# Patient Record
Sex: Female | Born: 1965 | Race: White | Hispanic: No | Marital: Single | Smoking: Current every day smoker
Health system: Southern US, Community
[De-identification: ages and names within clinical notes are randomized; demographics above are authoritative.]

## PROBLEM LIST (undated history)

## (undated) DIAGNOSIS — F17200 Nicotine dependence, unspecified, uncomplicated: Secondary | ICD-10-CM

## (undated) DIAGNOSIS — R0689 Other abnormalities of breathing: Secondary | ICD-10-CM

## (undated) DIAGNOSIS — C4490 Unspecified malignant neoplasm of skin, unspecified: Secondary | ICD-10-CM

## (undated) DIAGNOSIS — G8929 Other chronic pain: Secondary | ICD-10-CM

## (undated) DIAGNOSIS — J45909 Unspecified asthma, uncomplicated: Secondary | ICD-10-CM

## (undated) DIAGNOSIS — K8020 Calculus of gallbladder without cholecystitis without obstruction: Secondary | ICD-10-CM

## (undated) DIAGNOSIS — G4700 Insomnia, unspecified: Secondary | ICD-10-CM

## (undated) DIAGNOSIS — R319 Hematuria, unspecified: Secondary | ICD-10-CM

## (undated) DIAGNOSIS — F411 Generalized anxiety disorder: Secondary | ICD-10-CM

## (undated) DIAGNOSIS — G629 Polyneuropathy, unspecified: Secondary | ICD-10-CM

## (undated) DIAGNOSIS — N912 Amenorrhea, unspecified: Secondary | ICD-10-CM

## (undated) DIAGNOSIS — G4709 Other insomnia: Secondary | ICD-10-CM

## (undated) DIAGNOSIS — R079 Chest pain, unspecified: Secondary | ICD-10-CM

## (undated) DIAGNOSIS — I2510 Atherosclerotic heart disease of native coronary artery without angina pectoris: Secondary | ICD-10-CM

## (undated) HISTORY — DX: Other abnormalities of breathing: R06.89

## (undated) HISTORY — DX: Amenorrhea, unspecified: N91.2

## (undated) HISTORY — DX: Unspecified malignant neoplasm of skin, unspecified: C44.90

## (undated) HISTORY — DX: Other chronic pain: G89.29

## (undated) HISTORY — DX: Chest pain, unspecified: R07.9

## (undated) HISTORY — DX: Unspecified asthma, uncomplicated: J45.909

## (undated) HISTORY — DX: Other insomnia: G47.09

## (undated) HISTORY — DX: Atherosclerotic heart disease of native coronary artery without angina pectoris: I25.10

## (undated) HISTORY — DX: Insomnia, unspecified: G47.00

## (undated) HISTORY — DX: Calculus of gallbladder without cholecystitis without obstruction: K80.20

## (undated) HISTORY — DX: Hematuria, unspecified: R31.9

## (undated) HISTORY — DX: Nicotine dependence, unspecified, uncomplicated: F17.200

## (undated) HISTORY — DX: Generalized anxiety disorder: F41.1

## (undated) HISTORY — DX: Polyneuropathy, unspecified: G62.9

---

## 2004-12-31 ENCOUNTER — Inpatient Hospital Stay: Payer: Self-pay | Admitting: Internal Medicine

## 2004-12-31 ENCOUNTER — Other Ambulatory Visit: Payer: Self-pay

## 2005-06-05 ENCOUNTER — Inpatient Hospital Stay: Payer: Self-pay

## 2005-06-05 ENCOUNTER — Other Ambulatory Visit: Payer: Self-pay

## 2005-07-13 ENCOUNTER — Inpatient Hospital Stay: Payer: Self-pay | Admitting: Internal Medicine

## 2005-07-30 ENCOUNTER — Emergency Department (HOSPITAL_COMMUNITY): Admission: EM | Admit: 2005-07-30 | Discharge: 2005-07-30 | Payer: Self-pay | Admitting: Emergency Medicine

## 2005-09-08 ENCOUNTER — Ambulatory Visit: Payer: Self-pay | Admitting: Oncology

## 2006-02-14 HISTORY — PX: INTRAUTERINE DEVICE INSERTION: SHX323

## 2006-04-18 ENCOUNTER — Inpatient Hospital Stay: Payer: Self-pay | Admitting: Internal Medicine

## 2006-04-18 ENCOUNTER — Other Ambulatory Visit: Payer: Self-pay

## 2006-04-19 ENCOUNTER — Other Ambulatory Visit: Payer: Self-pay

## 2006-04-20 ENCOUNTER — Other Ambulatory Visit: Payer: Self-pay

## 2006-08-10 ENCOUNTER — Ambulatory Visit: Payer: Self-pay | Admitting: Internal Medicine

## 2006-08-16 ENCOUNTER — Ambulatory Visit: Payer: Self-pay | Admitting: Internal Medicine

## 2006-09-12 ENCOUNTER — Ambulatory Visit: Payer: Self-pay | Admitting: Gastroenterology

## 2006-09-12 HISTORY — PX: COLONOSCOPY: SHX174

## 2006-11-09 ENCOUNTER — Ambulatory Visit: Payer: Self-pay | Admitting: Internal Medicine

## 2007-01-14 ENCOUNTER — Other Ambulatory Visit: Payer: Self-pay

## 2007-01-14 ENCOUNTER — Emergency Department: Payer: Self-pay | Admitting: Emergency Medicine

## 2010-04-04 ENCOUNTER — Emergency Department: Payer: Self-pay | Admitting: Emergency Medicine

## 2012-02-19 ENCOUNTER — Emergency Department: Payer: Self-pay | Admitting: Emergency Medicine

## 2012-02-19 LAB — URINALYSIS, COMPLETE
Leukocyte Esterase: NEGATIVE
Ph: 5 (ref 4.5–8.0)
Protein: 100
RBC,UR: 10 /HPF (ref 0–5)
Specific Gravity: 1.031 (ref 1.003–1.030)
WBC UR: 12 /HPF (ref 0–5)

## 2012-02-19 LAB — TROPONIN I: Troponin-I: <0.02 ng/mL

## 2012-02-19 LAB — COMPREHENSIVE METABOLIC PANEL
Albumin: 3.8 g/dL (ref 3.4–5.0)
Alkaline Phosphatase: 104 U/L (ref 50–136)
Anion Gap: 14 (ref 7–16)
BUN: 12 mg/dL (ref 7–18)
Calcium, Total: 9.2 mg/dL (ref 8.5–10.1)
Chloride: 100 mmol/L (ref 98–107)
Co2: 25 mmol/L (ref 21–32)
EGFR (African American): >60
Glucose: 83 mg/dL (ref 65–99)
Potassium: 3.2 mmol/L — ABNORMAL LOW (ref 3.5–5.1)

## 2012-02-19 LAB — CBC
MCH: 30.4 pg (ref 26.0–34.0)
MCHC: 33.7 g/dL (ref 32.0–36.0)
MCV: 90 fL (ref 80–100)
Platelet: 101 10*3/uL — ABNORMAL LOW (ref 150–440)
RDW: 13.5 % (ref 11.5–14.5)

## 2012-02-19 LAB — CK TOTAL AND CKMB (NOT AT ARMC)
CK, Total: 103 U/L (ref 21–215)
CK-MB: <0.5 ng/mL — ABNORMAL LOW (ref 0.5–3.6)

## 2013-11-05 ENCOUNTER — Emergency Department: Payer: Self-pay | Admitting: Emergency Medicine

## 2014-08-14 ENCOUNTER — Ambulatory Visit: Payer: Self-pay | Admitting: Surgery

## 2014-09-08 HISTORY — PX: COLONOSCOPY: SHX174

## 2014-10-09 ENCOUNTER — Telehealth: Payer: Self-pay | Admitting: General Surgery

## 2014-10-09 ENCOUNTER — Ambulatory Visit: Payer: Self-pay | Admitting: General Surgery

## 2014-10-09 NOTE — Telephone Encounter (Signed)
Patient was a NO SHOW for her appointment with Dr Adonis Huguenin on 10/09/14

## 2014-10-22 ENCOUNTER — Ambulatory Visit (INDEPENDENT_AMBULATORY_CARE_PROVIDER_SITE_OTHER): Payer: Medicare Other | Admitting: Surgery

## 2014-10-22 ENCOUNTER — Encounter: Payer: Self-pay | Admitting: Surgery

## 2014-10-22 VITALS — BP 114/72 | HR 85 | Temp 98.1°F | Ht 65.0 in | Wt 116.0 lb

## 2014-10-22 DIAGNOSIS — K5909 Other constipation: Secondary | ICD-10-CM | POA: Diagnosis not present

## 2014-10-22 NOTE — Progress Notes (Signed)
Surgery Consult Note  CC: Bloating with PO, 1 bm per week, + gallstones on CT  HPI: Ms. Bridget Washington is a pleasant 49 yo F who presents with long term abdominal distention with PO intake.  Anything can cause this, has been eating meat less without any improvement.  Has only 1 bm per week.  Is on chronic narcotics, including fentanyl patch, for neuropathy.  Otherwise doing well.  No fevers/chills, night sweats, shortness of breath, cough, chest pain, abdominal pain, dysuria/hematuria.  Active Ambulatory Problems    Diagnosis Date Noted  . No Active Ambulatory Problems   Resolved Ambulatory Problems    Diagnosis Date Noted  . No Resolved Ambulatory Problems   Past Medical History  Diagnosis Date  . Asthma   . Chest pain   . CAD (coronary artery disease)   . Current smoker   . Difficulty breathing   . Polyneuropathy   . Cholelithiasis   . Hematuria   . Amenorrhea   . Secondary insomnia   . Generalized anxiety disorder   . Chronic pain    Past Surgical History  Procedure Laterality Date  . Colonoscopy  09/12/2006  . Colonoscopy  09/08/2014  . Intrauterine device insertion  2008     Medication List       This list is accurate as of: 10/22/14 10:57 AM.  Always use your most recent med list.               fentaNYL 25 MCG/HR patch  Commonly known as:  Elizabeth - dosed mcg/hr  Place 25 mcg onto the skin every 3 (three) days.     gabapentin 800 MG tablet  Commonly known as:  NEURONTIN  Take 800 mg by mouth 4 (four) times daily.     traMADol 50 MG tablet  Commonly known as:  ULTRAM  Take 1 tablet by mouth 1 day or 1 dose.       No Known Allergies   Social History   Social History  . Marital Status: Single    Spouse Name: N/A  . Number of Children: N/A  . Years of Education: N/A   Occupational History  . Not on file.   Social History Main Topics  . Smoking status: Current Every Day Smoker  . Smokeless tobacco: Never Used  . Alcohol Use: No  . Drug Use: Yes    Comment: Marijuana  . Sexual Activity: Not on file   Other Topics Concern  . Not on file   Social History Narrative   Family History  Problem Relation Age of Onset  . Breast cancer Mother   . Heart disease Mother   . Hypertension Mother   . Heart disease Father   . Hypertension Father   . Hypertension Maternal Grandmother   . Hypertension Maternal Grandfather   . Hypertension Paternal Grandmother   . Hypertension Paternal Grandfather    ROS: Full ROS obtained, pertinent positives and negatives as above  Blood pressure 114/72, pulse 85, temperature 98.1 F (36.7 C), height 5\' 5"  (1.651 m), weight 116 lb (52.617 kg), last menstrual period 10/15/2012. GEN: NAD/A&Ox3 FACE: no obvious facial trauma, normal external nose, normal external ears EYES: no scleral icterus, no conjunctivitis HEAD: normocephalic atraumatic CV: RRR, no MRG RESP: moving air well, lungs clear ABD: soft, nontender, nondistended EXT: moving all ext well, strength 5/5 NEURO: cnII-XII grossly intact, sensation intact all 4 ext  Labs: no new labs Imaging: report of CT reviewed, + gallstones  A/P 49 yo F with  bloating with PO and chronic constipation.  I feel that her symptoms are more consistent with constipation rather than biliary.  Have began [place] for opoid induced constipation.  Have given 1 month supply.  Will f/u in 1 month.

## 2014-10-22 NOTE — Patient Instructions (Addendum)
We have given you samples of Amitza, you should take this daily. Call with any problems after starting this.  We will see you back in 1 month to see how you are doing.  Call with any questions or concerns.

## 2014-11-19 ENCOUNTER — Encounter: Payer: Self-pay | Admitting: *Deleted

## 2014-11-19 ENCOUNTER — Emergency Department
Admission: EM | Admit: 2014-11-19 | Discharge: 2014-11-19 | Disposition: A | Discharge status: Home / Self Care | Payer: Medicare Other | Attending: Emergency Medicine | Admitting: Emergency Medicine

## 2014-11-19 ENCOUNTER — Emergency Department: Payer: Medicare Other

## 2014-11-19 ENCOUNTER — Telehealth: Payer: Self-pay | Admitting: Surgery

## 2014-11-19 DIAGNOSIS — R079 Chest pain, unspecified: Secondary | ICD-10-CM | POA: Diagnosis not present

## 2014-11-19 DIAGNOSIS — J45901 Unspecified asthma with (acute) exacerbation: Secondary | ICD-10-CM | POA: Diagnosis not present

## 2014-11-19 DIAGNOSIS — Z720 Tobacco use: Secondary | ICD-10-CM | POA: Insufficient documentation

## 2014-11-19 DIAGNOSIS — Z79891 Long term (current) use of opiate analgesic: Secondary | ICD-10-CM | POA: Insufficient documentation

## 2014-11-19 DIAGNOSIS — Z79899 Other long term (current) drug therapy: Secondary | ICD-10-CM | POA: Diagnosis not present

## 2014-11-19 DIAGNOSIS — R1010 Upper abdominal pain, unspecified: Secondary | ICD-10-CM | POA: Insufficient documentation

## 2014-11-19 LAB — BASIC METABOLIC PANEL
Anion gap: 7 (ref 5–15)
BUN: 10 mg/dL (ref 6–20)
CALCIUM: 9.4 mg/dL (ref 8.9–10.3)
CO2: 28 mmol/L (ref 22–32)
CREATININE: 0.61 mg/dL (ref 0.44–1.00)
Chloride: 99 mmol/L — ABNORMAL LOW (ref 101–111)
GFR calc non Af Amer: >60 mL/min (ref >60)
Glucose, Bld: 103 mg/dL — ABNORMAL HIGH (ref 65–99)
Potassium: 3.9 mmol/L (ref 3.5–5.1)
SODIUM: 134 mmol/L — AB (ref 135–145)

## 2014-11-19 LAB — CBC
HCT: 43 % (ref 35.0–47.0)
Hemoglobin: 14.2 g/dL (ref 12.0–16.0)
MCH: 30.7 pg (ref 26.0–34.0)
MCHC: 33.1 g/dL (ref 32.0–36.0)
MCV: 92.8 fL (ref 80.0–100.0)
PLATELETS: 154 10*3/uL (ref 150–440)
RBC: 4.63 MIL/uL (ref 3.80–5.20)
RDW: 13.8 % (ref 11.5–14.5)
WBC: 6.2 10*3/uL (ref 3.6–11.0)

## 2014-11-19 LAB — TROPONIN I

## 2014-11-19 LAB — HEPATIC FUNCTION PANEL
ALBUMIN: 4.2 g/dL (ref 3.5–5.0)
ALK PHOS: 79 U/L (ref 38–126)
ALT: 12 U/L — ABNORMAL LOW (ref 14–54)
AST: 19 U/L (ref 15–41)
BILIRUBIN TOTAL: 0.9 mg/dL (ref 0.3–1.2)
Bilirubin, Direct: <0.1 mg/dL — ABNORMAL LOW (ref 0.1–0.5)
TOTAL PROTEIN: 7.3 g/dL (ref 6.5–8.1)

## 2014-11-19 LAB — LIPASE, BLOOD: Lipase: 26 U/L (ref 22–51)

## 2014-11-19 MED ORDER — GI COCKTAIL ~~LOC~~
30.0000 mL | Freq: Once | ORAL | Status: AC
  Administered 2014-11-19: 30 mL via ORAL
  Filled 2014-11-19: qty 30

## 2014-11-19 MED ORDER — OXYCODONE-ACETAMINOPHEN 5-325 MG PO TABS: 1.0000 | ORAL_TABLET | Freq: Four times a day (QID) | ORAL | Status: DC | PRN

## 2014-11-19 NOTE — Telephone Encounter (Signed)
Returned phone call to patient at this time.  Patient states that she is having Substernal Chest Pain radiating through to her back that is very intense x 3 days. Also has Nausea and some dyspnea. Denies Fever/chills, diaphoresis, diarrhea, constipation.  She does complain of some abdominal pain as well. Continues to take Amitiza every 3rd day and this is helping wonderfully with constipation.  After speaking with patient briefly and reviewing her chart, informed her that she needs to either Call her cardiologist (Dr. Humphrey Rolls) immediately or Go straight to the Emergency Room. Explained that this could be life-threatening given these symptoms and that she needs to be evaluated as quickly as possible. She verbalizes understanding of this and will call Dr. Humphrey Rolls right now.  Patient scheduled to follow-up with Dr. Rexene Edison on 11/26/14.

## 2014-11-19 NOTE — Discharge Instructions (Signed)
Please follow-up with cardiology as soon as possible by calling the number provided. Return to the emergency department for any worsening chest pain, trouble breathing, or any other symptom personally concerning to yourself.   Nonspecific Chest Pain It is often hard to find the cause of chest pain. There is always a chance that your pain could be related to something serious, such as a heart attack or a blood clot in your lungs. Chest pain can also be caused by conditions that are not life-threatening. If you have chest pain, it is very important to follow up with your doctor.  HOME CARE  If you were prescribed an antibiotic medicine, finish it all even if you start to feel better.  Avoid any activities that cause chest pain.  Do not use any tobacco products, including cigarettes, chewing tobacco, or electronic cigarettes. If you need help quitting, ask your doctor.  Do not drink alcohol.  Take medicines only as told by your doctor.  Keep all follow-up visits as told by your doctor. This is important. This includes any further testing if your chest pain does not go away.  Your doctor may tell you to keep your head raised (elevated) while you sleep.  Make lifestyle changes as told by your doctor. These may include:  Getting regular exercise. Ask your doctor to suggest some activities that are safe for you.  Eating a heart-healthy diet. Your doctor or a diet specialist (dietitian) can help you to learn healthy eating options.  Maintaining a healthy weight.  Managing diabetes, if necessary.  Reducing stress. GET HELP IF:  Your chest pain does not go away, even after treatment.  You have a rash with blisters on your chest.  You have a fever. GET HELP RIGHT AWAY IF:  Your chest pain is worse.  You have an increasing cough, or you cough up blood.  You have severe belly (abdominal) pain.  You feel extremely weak.  You pass out (faint).  You have chills.  You have  sudden, unexplained chest discomfort.  You have sudden, unexplained discomfort in your arms, back, neck, or jaw.  You have shortness of breath at any time.  You suddenly start to sweat, or your skin gets clammy.  You feel nauseous.  You vomit.  You suddenly feel light-headed or dizzy.  Your heart begins to beat quickly, or it feels like it is skipping beats. These symptoms may be an emergency. Do not wait to see if the symptoms will go away. Get medical help right away. Call your local emergency services (911 in the [place] Do not drive yourself to the hospital.   This information is not intended to replace advice given to you by your health care provider. Make sure you discuss any questions you have with your health care provider.   Document Released: 07/20/2007 Document Revised: 02/21/2014 Document Reviewed: 09/06/2013 Elsevier Interactive Patient Education [employer].

## 2014-11-19 NOTE — ED Provider Notes (Signed)
[facility] Emergency Department Provider Note  Time seen: 7:21 PM  I have reviewed the triage vital signs and the nursing notes.   HISTORY  Chief Complaint Chest Pain    HPI Bridget Washington is a 49 y.o. female with a past medical history of asthma, chest pain, anxiety, chronic pain, presents the emergency department with chest pain. According to the patient for the past 4 days she has been having lower chest/upper abdominal pain. States today the pain is all been across her chest none in the abdomen. Denies any association with food. Denies any known reflux or gastritis. States she has had a cardiac catheterization in the past, no stents were placed. Sees Dr. Humphrey Rolls (cardiology), but he did not have an appointment today so she came to the emergency department. Describes her chest discomfort is moderate, aching, no nausea, vomiting, diarrhea. Does state occasional shortness of breath but has a history of COPD and states no more so than normal.     Past Medical History  Diagnosis Date  . Asthma   . Chest pain   . CAD (coronary artery disease)   . Current smoker   . Difficulty breathing   . Polyneuropathy ([place])   . Cholelithiasis   . Hematuria   . Amenorrhea   . Secondary insomnia   . Generalized anxiety disorder   . Chronic pain     There are no active problems to display for this patient.   Past Surgical History  Procedure Laterality Date  . Colonoscopy  09/12/2006  . Colonoscopy  09/08/2014  . Intrauterine device insertion  2008    Current Outpatient Rx  Name  Route  Sig  Dispense  Refill  . fentaNYL (DURAGESIC - DOSED MCG/HR) 25 MCG/HR patch   Transdermal   Place 25 mcg onto the skin every 3 (three) days.      0   . gabapentin (NEURONTIN) 800 MG tablet   Oral   Take 800 mg by mouth 4 (four) times daily.      3   . traMADol (ULTRAM) 50 MG tablet   Oral   Take 1 tablet by mouth 1 day or 1 dose.      0     Allergies Review of  patient's allergies indicates no known allergies.  Family History  Problem Relation Age of Onset  . Breast cancer Mother   . Heart disease Mother   . Hypertension Mother   . Heart disease Father   . Hypertension Father   . Hypertension Maternal Grandmother   . Hypertension Maternal Grandfather   . Hypertension Paternal Grandmother   . Hypertension Paternal Grandfather     Social History Social History  Substance Use Topics  . Smoking status: Current Every Day Smoker  . Smokeless tobacco: Never Used  . Alcohol Use: No    Review of Systems Constitutional: Negative for fever. Cardiovascular: chest pain 4 days. Respiratory: Shortness of breath, but no more so than normal. Gastrointestinal: Occasional upper abdominal pain Neurological: Negative for headache 10-point ROS otherwise negative.  ____________________________________________   PHYSICAL EXAM:  VITAL SIGNS: ED Triage Vitals  Enc Vitals Group     BP 11/19/14 1739 132/57 mmHg     Pulse Rate 11/19/14 1739 68     Resp 11/19/14 1739 18     Temp 11/19/14 1739 98.5 F (36.9 C)     Temp Source 11/19/14 1739 Oral     SpO2 11/19/14 1739 95 %     Weight  11/19/14 1739 109 lb (49.442 kg)     Height 11/19/14 1739 5\' 5"  (1.651 m)     Head Cir --      Peak Flow --      Pain Score 11/19/14 1743 9     Pain Loc --      Pain Edu? --      Excl. in [place]? --     Constitutional: Alert and oriented. Well appearing and in no distress. Eyes: Normal exam ENT   Head: Normocephalic and atraumatic   Mouth/Throat: Mucous membranes are moist. Cardiovascular: Normal rate, regular rhythm. No murmur Respiratory: Normal respiratory effort without tachypnea nor retractions. Breath sounds are clear and equal bilaterally. No wheezes/rales/rhonchi. Mild chest tenderness to palpation. Gastrointestinal: Soft and nontender. No distention Musculoskeletal: Nontender with normal range of motion in all extremities. Neurologic:  Normal speech  and language. No gross focal neurologic deficits Psychiatric: Mood and affect are normal. Speech and behavior are normal.  ____________________________________________    EKG  EKG reviewed and interpreted by myself shows sinus rhythm at 65 bpm, narrow QRS, normal axis, normal intervals, nonspecific ST changes present.   ____________________________________________    RADIOLOGY  No acute changes on chest x-ray.  ____________________________________________    INITIAL IMPRESSION / ASSESSMENT AND PLAN / ED COURSE  Pertinent labs & imaging results that were available during my care of the patient were reviewed by me and considered in my medical decision making (see chart for details).  Labs are largely within normal limits. X-ray within normal limits. EKG does not show any acute-concerning changes. We will check a second troponin as well as check hepatic function panel. Patient has follow-up with Dr. Humphrey Rolls.  Second troponin is within normal limits. Hepatic function panel within normal limits. We'll discharge home with cardiology follow-up. Patient is agreeable to plan.  ____________________________________________   FINAL CLINICAL IMPRESSION(S) / ED DIAGNOSES  Chest pain   Harvest Dark, MD 11/19/14 2125

## 2014-11-19 NOTE — Telephone Encounter (Signed)
Patients back hurting, can't move, in the bed, stomach hurting

## 2014-11-19 NOTE — ED Notes (Signed)
Pt reports onset of soreness in her chest since yesterday. About 4 days ago she started having pain in her upper abdomen that now has moved up into her chest. Pt has hx of gallstones (scheduled to see lundquist on the 12th).

## 2014-11-26 ENCOUNTER — Ambulatory Visit: Payer: Medicare Other | Admitting: Surgery

## 2015-06-12 ENCOUNTER — Other Ambulatory Visit: Payer: Self-pay | Admitting: Internal Medicine

## 2015-06-12 DIAGNOSIS — F411 Generalized anxiety disorder: Secondary | ICD-10-CM | POA: Diagnosis not present

## 2015-06-12 DIAGNOSIS — K8080 Other cholelithiasis without obstruction: Secondary | ICD-10-CM | POA: Diagnosis not present

## 2015-06-12 DIAGNOSIS — D492 Neoplasm of unspecified behavior of bone, soft tissue, and skin: Secondary | ICD-10-CM | POA: Diagnosis not present

## 2015-06-12 DIAGNOSIS — N912 Amenorrhea, unspecified: Secondary | ICD-10-CM | POA: Diagnosis not present

## 2015-06-12 DIAGNOSIS — R319 Hematuria, unspecified: Secondary | ICD-10-CM | POA: Diagnosis not present

## 2015-06-12 DIAGNOSIS — J301 Allergic rhinitis due to pollen: Secondary | ICD-10-CM | POA: Diagnosis not present

## 2015-06-12 DIAGNOSIS — R2232 Localized swelling, mass and lump, left upper limb: Secondary | ICD-10-CM

## 2015-06-12 DIAGNOSIS — G603 Idiopathic progressive neuropathy: Secondary | ICD-10-CM | POA: Diagnosis not present

## 2015-06-12 DIAGNOSIS — G4709 Other insomnia: Secondary | ICD-10-CM | POA: Diagnosis not present

## 2015-06-12 DIAGNOSIS — G8929 Other chronic pain: Secondary | ICD-10-CM | POA: Diagnosis not present

## 2015-06-19 ENCOUNTER — Ambulatory Visit: Payer: Medicare Other

## 2015-07-01 ENCOUNTER — Ambulatory Visit
Admission: RE | Admit: 2015-07-01 | Discharge: 2015-07-01 | Disposition: A | Discharge status: Home / Self Care | Payer: PPO | Source: Ambulatory Visit | Attending: Internal Medicine | Admitting: Internal Medicine

## 2015-07-01 DIAGNOSIS — R2232 Localized swelling, mass and lump, left upper limb: Secondary | ICD-10-CM | POA: Diagnosis not present

## 2015-08-12 DIAGNOSIS — G609 Hereditary and idiopathic neuropathy, unspecified: Secondary | ICD-10-CM | POA: Diagnosis not present

## 2015-08-12 DIAGNOSIS — R319 Hematuria, unspecified: Secondary | ICD-10-CM | POA: Diagnosis not present

## 2015-08-12 DIAGNOSIS — F411 Generalized anxiety disorder: Secondary | ICD-10-CM | POA: Diagnosis not present

## 2015-08-12 DIAGNOSIS — K8080 Other cholelithiasis without obstruction: Secondary | ICD-10-CM | POA: Diagnosis not present

## 2015-08-12 DIAGNOSIS — D492 Neoplasm of unspecified behavior of bone, soft tissue, and skin: Secondary | ICD-10-CM | POA: Diagnosis not present

## 2015-08-12 DIAGNOSIS — N912 Amenorrhea, unspecified: Secondary | ICD-10-CM | POA: Diagnosis not present

## 2015-08-12 DIAGNOSIS — G4709 Other insomnia: Secondary | ICD-10-CM | POA: Diagnosis not present

## 2015-08-12 DIAGNOSIS — G8929 Other chronic pain: Secondary | ICD-10-CM | POA: Diagnosis not present

## 2015-08-12 DIAGNOSIS — J301 Allergic rhinitis due to pollen: Secondary | ICD-10-CM | POA: Diagnosis not present

## 2015-08-12 DIAGNOSIS — G603 Idiopathic progressive neuropathy: Secondary | ICD-10-CM | POA: Diagnosis not present

## 2015-08-13 ENCOUNTER — Other Ambulatory Visit: Payer: Self-pay | Admitting: Internal Medicine

## 2015-08-13 DIAGNOSIS — Z1231 Encounter for screening mammogram for malignant neoplasm of breast: Secondary | ICD-10-CM

## 2015-09-04 ENCOUNTER — Other Ambulatory Visit: Payer: Self-pay | Admitting: Internal Medicine

## 2015-09-04 ENCOUNTER — Ambulatory Visit
Admission: RE | Admit: 2015-09-04 | Discharge: 2015-09-04 | Disposition: A | Discharge status: Home / Self Care | Payer: PPO | Source: Ambulatory Visit | Attending: Internal Medicine | Admitting: Internal Medicine

## 2015-09-04 DIAGNOSIS — Z1231 Encounter for screening mammogram for malignant neoplasm of breast: Secondary | ICD-10-CM | POA: Insufficient documentation

## 2015-09-09 ENCOUNTER — Other Ambulatory Visit: Payer: Self-pay | Admitting: Internal Medicine

## 2015-09-09 DIAGNOSIS — N6310 Unspecified lump in the right breast, unspecified quadrant: Secondary | ICD-10-CM

## 2015-09-16 ENCOUNTER — Ambulatory Visit
Admission: RE | Admit: 2015-09-16 | Discharge: 2015-09-16 | Disposition: A | Discharge status: Home / Self Care | Payer: PPO | Source: Ambulatory Visit | Attending: Internal Medicine | Admitting: Internal Medicine

## 2015-09-16 DIAGNOSIS — N6310 Unspecified lump in the right breast, unspecified quadrant: Secondary | ICD-10-CM

## 2015-09-16 DIAGNOSIS — N63 Unspecified lump in breast: Secondary | ICD-10-CM | POA: Diagnosis not present

## 2015-09-30 ENCOUNTER — Other Ambulatory Visit: Payer: Self-pay | Admitting: Internal Medicine

## 2015-09-30 DIAGNOSIS — N63 Unspecified lump in unspecified breast: Secondary | ICD-10-CM

## 2015-10-13 DIAGNOSIS — K8080 Other cholelithiasis without obstruction: Secondary | ICD-10-CM | POA: Diagnosis not present

## 2015-10-13 DIAGNOSIS — F411 Generalized anxiety disorder: Secondary | ICD-10-CM | POA: Diagnosis not present

## 2015-10-13 DIAGNOSIS — N912 Amenorrhea, unspecified: Secondary | ICD-10-CM | POA: Diagnosis not present

## 2015-10-13 DIAGNOSIS — G4709 Other insomnia: Secondary | ICD-10-CM | POA: Diagnosis not present

## 2015-10-13 DIAGNOSIS — G609 Hereditary and idiopathic neuropathy, unspecified: Secondary | ICD-10-CM | POA: Diagnosis not present

## 2015-10-13 DIAGNOSIS — D492 Neoplasm of unspecified behavior of bone, soft tissue, and skin: Secondary | ICD-10-CM | POA: Diagnosis not present

## 2015-10-13 DIAGNOSIS — G8929 Other chronic pain: Secondary | ICD-10-CM | POA: Diagnosis not present

## 2015-10-13 DIAGNOSIS — J301 Allergic rhinitis due to pollen: Secondary | ICD-10-CM | POA: Diagnosis not present

## 2015-10-13 DIAGNOSIS — G603 Idiopathic progressive neuropathy: Secondary | ICD-10-CM | POA: Diagnosis not present

## 2015-10-13 DIAGNOSIS — R319 Hematuria, unspecified: Secondary | ICD-10-CM | POA: Diagnosis not present

## 2015-12-02 DIAGNOSIS — M19042 Primary osteoarthritis, left hand: Secondary | ICD-10-CM | POA: Diagnosis not present

## 2015-12-02 DIAGNOSIS — M19041 Primary osteoarthritis, right hand: Secondary | ICD-10-CM | POA: Diagnosis not present

## 2015-12-14 DIAGNOSIS — K8080 Other cholelithiasis without obstruction: Secondary | ICD-10-CM | POA: Diagnosis not present

## 2015-12-14 DIAGNOSIS — F411 Generalized anxiety disorder: Secondary | ICD-10-CM | POA: Diagnosis not present

## 2015-12-14 DIAGNOSIS — G4709 Other insomnia: Secondary | ICD-10-CM | POA: Diagnosis not present

## 2015-12-14 DIAGNOSIS — G8929 Other chronic pain: Secondary | ICD-10-CM | POA: Diagnosis not present

## 2015-12-14 DIAGNOSIS — J301 Allergic rhinitis due to pollen: Secondary | ICD-10-CM | POA: Diagnosis not present

## 2015-12-14 DIAGNOSIS — R319 Hematuria, unspecified: Secondary | ICD-10-CM | POA: Diagnosis not present

## 2015-12-14 DIAGNOSIS — D492 Neoplasm of unspecified behavior of bone, soft tissue, and skin: Secondary | ICD-10-CM | POA: Diagnosis not present

## 2015-12-14 DIAGNOSIS — G603 Idiopathic progressive neuropathy: Secondary | ICD-10-CM | POA: Diagnosis not present

## 2015-12-14 DIAGNOSIS — G609 Hereditary and idiopathic neuropathy, unspecified: Secondary | ICD-10-CM | POA: Diagnosis not present

## 2015-12-14 DIAGNOSIS — N912 Amenorrhea, unspecified: Secondary | ICD-10-CM | POA: Diagnosis not present

## 2016-01-20 DIAGNOSIS — F17210 Nicotine dependence, cigarettes, uncomplicated: Secondary | ICD-10-CM | POA: Insufficient documentation

## 2016-01-20 DIAGNOSIS — R05 Cough: Secondary | ICD-10-CM | POA: Diagnosis not present

## 2016-02-03 DIAGNOSIS — L989 Disorder of the skin and subcutaneous tissue, unspecified: Secondary | ICD-10-CM | POA: Diagnosis not present

## 2016-02-03 DIAGNOSIS — J208 Acute bronchitis due to other specified organisms: Secondary | ICD-10-CM | POA: Diagnosis not present

## 2016-02-03 DIAGNOSIS — B9689 Other specified bacterial agents as the cause of diseases classified elsewhere: Secondary | ICD-10-CM | POA: Diagnosis not present

## 2016-02-16 DIAGNOSIS — G609 Hereditary and idiopathic neuropathy, unspecified: Secondary | ICD-10-CM | POA: Diagnosis not present

## 2016-02-16 DIAGNOSIS — R319 Hematuria, unspecified: Secondary | ICD-10-CM | POA: Diagnosis not present

## 2016-02-16 DIAGNOSIS — D492 Neoplasm of unspecified behavior of bone, soft tissue, and skin: Secondary | ICD-10-CM | POA: Diagnosis not present

## 2016-02-16 DIAGNOSIS — J301 Allergic rhinitis due to pollen: Secondary | ICD-10-CM | POA: Diagnosis not present

## 2016-02-16 DIAGNOSIS — J209 Acute bronchitis, unspecified: Secondary | ICD-10-CM | POA: Diagnosis not present

## 2016-02-16 DIAGNOSIS — K8080 Other cholelithiasis without obstruction: Secondary | ICD-10-CM | POA: Diagnosis not present

## 2016-02-16 DIAGNOSIS — G4709 Other insomnia: Secondary | ICD-10-CM | POA: Diagnosis not present

## 2016-02-16 DIAGNOSIS — F411 Generalized anxiety disorder: Secondary | ICD-10-CM | POA: Diagnosis not present

## 2016-02-16 DIAGNOSIS — G8929 Other chronic pain: Secondary | ICD-10-CM | POA: Diagnosis not present

## 2016-02-16 DIAGNOSIS — G603 Idiopathic progressive neuropathy: Secondary | ICD-10-CM | POA: Diagnosis not present

## 2016-02-16 DIAGNOSIS — N912 Amenorrhea, unspecified: Secondary | ICD-10-CM | POA: Diagnosis not present

## 2016-03-11 DIAGNOSIS — L905 Scar conditions and fibrosis of skin: Secondary | ICD-10-CM | POA: Diagnosis not present

## 2016-03-11 DIAGNOSIS — C44722 Squamous cell carcinoma of skin of right lower limb, including hip: Secondary | ICD-10-CM | POA: Diagnosis not present

## 2016-03-11 DIAGNOSIS — D485 Neoplasm of uncertain behavior of skin: Secondary | ICD-10-CM | POA: Diagnosis not present

## 2016-03-21 ENCOUNTER — Ambulatory Visit
Admission: RE | Admit: 2016-03-21 | Discharge: 2016-03-21 | Disposition: A | Discharge status: Home / Self Care | Payer: PPO | Source: Ambulatory Visit | Attending: Internal Medicine | Admitting: Internal Medicine

## 2016-03-21 DIAGNOSIS — N63 Unspecified lump in unspecified breast: Secondary | ICD-10-CM

## 2016-03-21 DIAGNOSIS — N6311 Unspecified lump in the right breast, upper outer quadrant: Secondary | ICD-10-CM | POA: Diagnosis not present

## 2016-03-21 DIAGNOSIS — N6312 Unspecified lump in the right breast, upper inner quadrant: Secondary | ICD-10-CM | POA: Diagnosis not present

## 2016-03-21 DIAGNOSIS — N6310 Unspecified lump in the right breast, unspecified quadrant: Secondary | ICD-10-CM | POA: Insufficient documentation

## 2016-03-25 DIAGNOSIS — C44722 Squamous cell carcinoma of skin of right lower limb, including hip: Secondary | ICD-10-CM | POA: Diagnosis not present

## 2016-03-28 DIAGNOSIS — L905 Scar conditions and fibrosis of skin: Secondary | ICD-10-CM | POA: Diagnosis not present

## 2016-04-15 DIAGNOSIS — N912 Amenorrhea, unspecified: Secondary | ICD-10-CM | POA: Diagnosis not present

## 2016-04-15 DIAGNOSIS — G603 Idiopathic progressive neuropathy: Secondary | ICD-10-CM | POA: Diagnosis not present

## 2016-04-15 DIAGNOSIS — D492 Neoplasm of unspecified behavior of bone, soft tissue, and skin: Secondary | ICD-10-CM | POA: Diagnosis not present

## 2016-04-15 DIAGNOSIS — J301 Allergic rhinitis due to pollen: Secondary | ICD-10-CM | POA: Diagnosis not present

## 2016-04-15 DIAGNOSIS — G4709 Other insomnia: Secondary | ICD-10-CM | POA: Diagnosis not present

## 2016-04-15 DIAGNOSIS — F411 Generalized anxiety disorder: Secondary | ICD-10-CM | POA: Diagnosis not present

## 2016-04-15 DIAGNOSIS — G8929 Other chronic pain: Secondary | ICD-10-CM | POA: Diagnosis not present

## 2016-04-15 DIAGNOSIS — K8080 Other cholelithiasis without obstruction: Secondary | ICD-10-CM | POA: Diagnosis not present

## 2016-04-27 DIAGNOSIS — F419 Anxiety disorder, unspecified: Secondary | ICD-10-CM | POA: Insufficient documentation

## 2016-04-27 DIAGNOSIS — J45909 Unspecified asthma, uncomplicated: Secondary | ICD-10-CM | POA: Insufficient documentation

## 2016-04-27 DIAGNOSIS — G8929 Other chronic pain: Secondary | ICD-10-CM | POA: Insufficient documentation

## 2016-04-27 DIAGNOSIS — G629 Polyneuropathy, unspecified: Secondary | ICD-10-CM | POA: Insufficient documentation

## 2016-04-28 ENCOUNTER — Ambulatory Visit: Payer: PPO | Admitting: Surgery

## 2016-05-04 ENCOUNTER — Other Ambulatory Visit: Payer: Self-pay

## 2016-05-06 ENCOUNTER — Ambulatory Visit: Payer: Self-pay | Admitting: Surgery

## 2016-05-12 ENCOUNTER — Other Ambulatory Visit: Payer: Self-pay | Admitting: *Deleted

## 2016-06-01 DIAGNOSIS — I2510 Atherosclerotic heart disease of native coronary artery without angina pectoris: Secondary | ICD-10-CM | POA: Diagnosis not present

## 2016-06-01 DIAGNOSIS — Z113 Encounter for screening for infections with a predominantly sexual mode of transmission: Secondary | ICD-10-CM | POA: Diagnosis not present

## 2016-06-01 DIAGNOSIS — R109 Unspecified abdominal pain: Secondary | ICD-10-CM | POA: Diagnosis not present

## 2016-06-02 DIAGNOSIS — R109 Unspecified abdominal pain: Secondary | ICD-10-CM | POA: Diagnosis not present

## 2016-06-06 DIAGNOSIS — R002 Palpitations: Secondary | ICD-10-CM | POA: Diagnosis not present

## 2016-06-06 DIAGNOSIS — R0602 Shortness of breath: Secondary | ICD-10-CM | POA: Diagnosis not present

## 2016-06-06 DIAGNOSIS — I10 Essential (primary) hypertension: Secondary | ICD-10-CM | POA: Diagnosis not present

## 2016-06-06 DIAGNOSIS — R071 Chest pain on breathing: Secondary | ICD-10-CM | POA: Diagnosis not present

## 2016-06-06 DIAGNOSIS — E782 Mixed hyperlipidemia: Secondary | ICD-10-CM | POA: Diagnosis not present

## 2016-06-06 DIAGNOSIS — I2510 Atherosclerotic heart disease of native coronary artery without angina pectoris: Secondary | ICD-10-CM | POA: Diagnosis not present

## 2016-06-06 DIAGNOSIS — I4891 Unspecified atrial fibrillation: Secondary | ICD-10-CM | POA: Diagnosis not present

## 2016-06-15 DIAGNOSIS — K8080 Other cholelithiasis without obstruction: Secondary | ICD-10-CM | POA: Diagnosis not present

## 2016-06-15 DIAGNOSIS — G4709 Other insomnia: Secondary | ICD-10-CM | POA: Diagnosis not present

## 2016-06-15 DIAGNOSIS — F411 Generalized anxiety disorder: Secondary | ICD-10-CM | POA: Diagnosis not present

## 2016-06-15 DIAGNOSIS — G603 Idiopathic progressive neuropathy: Secondary | ICD-10-CM | POA: Diagnosis not present

## 2016-06-15 DIAGNOSIS — G8929 Other chronic pain: Secondary | ICD-10-CM | POA: Diagnosis not present

## 2016-06-15 DIAGNOSIS — N912 Amenorrhea, unspecified: Secondary | ICD-10-CM | POA: Diagnosis not present

## 2016-06-15 DIAGNOSIS — J301 Allergic rhinitis due to pollen: Secondary | ICD-10-CM | POA: Diagnosis not present

## 2016-07-04 DIAGNOSIS — Z85828 Personal history of other malignant neoplasm of skin: Secondary | ICD-10-CM | POA: Diagnosis not present

## 2016-07-04 DIAGNOSIS — D225 Melanocytic nevi of trunk: Secondary | ICD-10-CM | POA: Diagnosis not present

## 2016-07-04 DIAGNOSIS — D485 Neoplasm of uncertain behavior of skin: Secondary | ICD-10-CM | POA: Diagnosis not present

## 2016-07-04 DIAGNOSIS — D2261 Melanocytic nevi of right upper limb, including shoulder: Secondary | ICD-10-CM | POA: Diagnosis not present

## 2016-07-04 DIAGNOSIS — L820 Inflamed seborrheic keratosis: Secondary | ICD-10-CM | POA: Diagnosis not present

## 2016-07-04 DIAGNOSIS — Z08 Encounter for follow-up examination after completed treatment for malignant neoplasm: Secondary | ICD-10-CM | POA: Diagnosis not present

## 2016-07-04 DIAGNOSIS — C44519 Basal cell carcinoma of skin of other part of trunk: Secondary | ICD-10-CM | POA: Diagnosis not present

## 2016-07-25 DIAGNOSIS — C44519 Basal cell carcinoma of skin of other part of trunk: Secondary | ICD-10-CM | POA: Diagnosis not present

## 2016-09-12 DIAGNOSIS — K8080 Other cholelithiasis without obstruction: Secondary | ICD-10-CM | POA: Diagnosis not present

## 2016-09-12 DIAGNOSIS — G8929 Other chronic pain: Secondary | ICD-10-CM | POA: Diagnosis not present

## 2016-09-12 DIAGNOSIS — G603 Idiopathic progressive neuropathy: Secondary | ICD-10-CM | POA: Diagnosis not present

## 2016-09-12 DIAGNOSIS — J301 Allergic rhinitis due to pollen: Secondary | ICD-10-CM | POA: Diagnosis not present

## 2016-09-12 DIAGNOSIS — G4709 Other insomnia: Secondary | ICD-10-CM | POA: Diagnosis not present

## 2016-09-12 DIAGNOSIS — F411 Generalized anxiety disorder: Secondary | ICD-10-CM | POA: Diagnosis not present

## 2016-09-12 DIAGNOSIS — N912 Amenorrhea, unspecified: Secondary | ICD-10-CM | POA: Diagnosis not present

## 2016-11-14 DIAGNOSIS — N912 Amenorrhea, unspecified: Secondary | ICD-10-CM | POA: Diagnosis not present

## 2016-11-14 DIAGNOSIS — G603 Idiopathic progressive neuropathy: Secondary | ICD-10-CM | POA: Diagnosis not present

## 2016-11-14 DIAGNOSIS — G8929 Other chronic pain: Secondary | ICD-10-CM | POA: Diagnosis not present

## 2016-11-14 DIAGNOSIS — K8080 Other cholelithiasis without obstruction: Secondary | ICD-10-CM | POA: Diagnosis not present

## 2016-11-14 DIAGNOSIS — F411 Generalized anxiety disorder: Secondary | ICD-10-CM | POA: Diagnosis not present

## 2016-11-14 DIAGNOSIS — G4709 Other insomnia: Secondary | ICD-10-CM | POA: Diagnosis not present

## 2016-11-14 DIAGNOSIS — J301 Allergic rhinitis due to pollen: Secondary | ICD-10-CM | POA: Diagnosis not present

## 2016-11-14 DIAGNOSIS — M545 Low back pain: Secondary | ICD-10-CM | POA: Diagnosis not present

## 2016-12-01 DIAGNOSIS — G603 Idiopathic progressive neuropathy: Secondary | ICD-10-CM | POA: Diagnosis not present

## 2017-01-16 DIAGNOSIS — K8080 Other cholelithiasis without obstruction: Secondary | ICD-10-CM | POA: Diagnosis not present

## 2017-01-16 DIAGNOSIS — M545 Low back pain: Secondary | ICD-10-CM | POA: Diagnosis not present

## 2017-01-16 DIAGNOSIS — G603 Idiopathic progressive neuropathy: Secondary | ICD-10-CM | POA: Diagnosis not present

## 2017-01-16 DIAGNOSIS — F411 Generalized anxiety disorder: Secondary | ICD-10-CM | POA: Diagnosis not present

## 2017-01-16 DIAGNOSIS — G8929 Other chronic pain: Secondary | ICD-10-CM | POA: Diagnosis not present

## 2017-01-16 DIAGNOSIS — N912 Amenorrhea, unspecified: Secondary | ICD-10-CM | POA: Diagnosis not present

## 2017-01-16 DIAGNOSIS — G4709 Other insomnia: Secondary | ICD-10-CM | POA: Diagnosis not present

## 2017-01-16 DIAGNOSIS — J301 Allergic rhinitis due to pollen: Secondary | ICD-10-CM | POA: Diagnosis not present

## 2017-01-17 ENCOUNTER — Ambulatory Visit: Payer: PPO | Admitting: Certified Nurse Midwife

## 2017-01-17 ENCOUNTER — Encounter: Payer: Self-pay | Admitting: Certified Nurse Midwife

## 2017-01-17 ENCOUNTER — Other Ambulatory Visit: Payer: Self-pay

## 2017-01-17 VITALS — BP 125/72 | HR 60 | Ht 65.5 in | Wt 126.5 lb

## 2017-01-17 DIAGNOSIS — Z87898 Personal history of other specified conditions: Secondary | ICD-10-CM | POA: Diagnosis not present

## 2017-01-17 DIAGNOSIS — Z8742 Personal history of other diseases of the female genital tract: Secondary | ICD-10-CM

## 2017-01-17 DIAGNOSIS — N898 Other specified noninflammatory disorders of vagina: Secondary | ICD-10-CM | POA: Diagnosis not present

## 2017-01-17 DIAGNOSIS — F1290 Cannabis use, unspecified, uncomplicated: Secondary | ICD-10-CM | POA: Diagnosis not present

## 2017-01-17 DIAGNOSIS — N941 Unspecified dyspareunia: Secondary | ICD-10-CM

## 2017-01-17 NOTE — Patient Instructions (Addendum)
Levonorgestrel intrauterine device (IUD) What is this medicine? LEVONORGESTREL IUD (LEE voe nor jes trel) is a contraceptive (birth control) device. The device is placed inside the uterus by a healthcare professional. It is used to prevent pregnancy. This device can also be used to treat heavy bleeding that occurs during your period. This medicine may be used for other purposes; ask your health care provider or pharmacist if you have questions. COMMON BRAND NAME(S): Minette Headland What should I tell my health care provider before I take this medicine? They need to know if you have any of these conditions: -abnormal Pap smear -cancer of the breast, uterus, or cervix -diabetes -endometritis -genital or pelvic infection now or in the past -have more than one sexual partner or your partner has more than one partner -heart disease -history of an ectopic or tubal pregnancy -immune system problems -IUD in place -liver disease or tumor -problems with blood clots or take blood-thinners -seizures -use intravenous drugs -uterus of unusual shape -vaginal bleeding that has not been explained -an unusual or allergic reaction to levonorgestrel, other hormones, silicone, or polyethylene, medicines, foods, dyes, or preservatives -pregnant or trying to get pregnant -breast-feeding How should I use this medicine? This device is placed inside the uterus by a health care professional. Talk to your pediatrician regarding the use of this medicine in children. Special care may be needed. Overdosage: If you think you have taken too much of this medicine contact a poison control center or emergency room at once. NOTE: This medicine is only for you. Do not share this medicine with others. What if I miss a dose? This does not apply. Depending on the brand of device you have inserted, the device will need to be replaced every 3 to 5 years if you wish to continue using this type of birth  control. What may interact with this medicine? Do not take this medicine with any of the following medications: -amprenavir -bosentan -fosamprenavir This medicine may also interact with the following medications: -aprepitant -armodafinil -barbiturate medicines for inducing sleep or treating seizures -bexarotene -boceprevir -griseofulvin -medicines to treat seizures like carbamazepine, ethotoin, felbamate, oxcarbazepine, phenytoin, topiramate -modafinil -pioglitazone -rifabutin -rifampin -rifapentine -some medicines to treat HIV infection like atazanavir, efavirenz, indinavir, lopinavir, nelfinavir, tipranavir, ritonavir -St. John's wort -warfarin This list may not describe all possible interactions. Give your health care provider a list of all the medicines, herbs, non-prescription drugs, or dietary supplements you use. Also tell them if you smoke, drink alcohol, or use illegal drugs. Some items may interact with your medicine. What should I watch for while using this medicine? Visit your doctor or health care professional for regular check ups. See your doctor if you or your partner has sexual contact with others, becomes HIV positive, or gets a sexual transmitted disease. This product does not protect you against HIV infection (AIDS) or other sexually transmitted diseases. You can check the placement of the IUD yourself by reaching up to the top of your vagina with clean fingers to feel the threads. Do not pull on the threads. It is a good habit to check placement after each menstrual period. Call your doctor right away if you feel more of the IUD than just the threads or if you cannot feel the threads at all. The IUD may come out by itself. You may become pregnant if the device comes out. If you notice that the IUD has come out use a backup birth control method like condoms and call your  health care provider. Using tampons will not change the position of the IUD and are okay to use  during your period. This IUD can be safely scanned with magnetic resonance imaging (MRI) only under specific conditions. Before you have an MRI, tell your healthcare provider that you have an IUD in place, and which type of IUD you have in place. What side effects may I notice from receiving this medicine? Side effects that you should report to your doctor or health care professional as soon as possible: -allergic reactions like skin rash, itching or hives, swelling of the face, lips, or tongue -fever, flu-like symptoms -genital sores -high blood pressure -no menstrual period for 6 weeks during use -pain, swelling, warmth in the leg -pelvic pain or tenderness -severe or sudden headache -signs of pregnancy -stomach cramping -sudden shortness of breath -trouble with balance, talking, or walking -unusual vaginal bleeding, discharge -yellowing of the eyes or skin Side effects that usually do not require medical attention (report to your doctor or health care professional if they continue or are bothersome): -acne -breast pain -change in sex drive or performance -changes in weight -cramping, dizziness, or faintness while the device is being inserted -headache -irregular menstrual bleeding within first 3 to 6 months of use -nausea This list may not describe all possible side effects. Call your doctor for medical advice about side effects. You may report side effects to FDA at 1-800-FDA-1088. Where should I keep my medicine? This does not apply. NOTE: This sheet is a summary. It may not cover all possible information. If you have questions about this medicine, talk to your doctor, pharmacist, or health care provider.  2018 Elsevier/Gold Standard (2015-11-13 14:14:56) Fibrocystic Breast Changes Fibrocystic breast changes are changes that can make your breasts swollen or painful. These changes happen when tiny sacs of fluid (cysts) form in the breast. This is a common condition. It does not  mean that you have cancer. It usually happens because of hormone changes during a monthly period. Follow these instructions at home:  Check your breasts after every monthly period. If you do not have monthly periods, check your breasts on the first day of every month. Check for: ? Soreness. ? New swelling or puffiness. ? A change in breast size. ? A change in a lump that was already there.  Take over-the-counter and prescription medicines only as told by your doctor.  Wear a support or sports bra that fits well. Wear this support especially when you are exercising.  Avoid or have less caffeine, fat, and sugar in what you eat and drink as told by your doctor. Contact a doctor if:  You have fluid coming from your nipple, especially if the fluid has blood in it.  You have new lumps or bumps in your breast.  Your breast gets puffy, red, and painful.  You have changes in how your breast looks.  Your nipple looks flat or it sinks into your breast. Get help right away if:  Your breast turns red, and the redness is spreading. Summary  Fibrocystic breast changes are changes that can make your breasts swollen or painful.  This condition can happen when you have hormone changes during your monthly period.  With this condition, it is important to check your breasts after every monthly period. If you do not have monthly periods, check your breasts on the first day of every month. This information is not intended to replace advice given to you by your health care provider. Make sure  you discuss any questions you have with your health care provider. Document Released: 01/14/2008 Document Revised: 10/15/2015 Document Reviewed: 10/15/2015 Elsevier Interactive Patient Education  2017 [place] Years, Female Preventive care refers to lifestyle choices and visits with your health care provider that can promote health and wellness. What does preventive care include?  A  yearly physical exam. This is also called an annual well check.  Dental exams once or twice a year.  Routine eye exams. Ask your health care provider how often you should have your eyes checked.  Personal lifestyle choices, including: ? Daily care of your teeth and gums. ? Regular physical activity. ? Eating a healthy diet. ? Avoiding tobacco and drug use. ? Limiting alcohol use. ? Practicing safe sex. ? Taking low-dose aspirin daily starting at age 8. ? Taking vitamin and mineral supplements as recommended by your health care provider. What happens during an annual well check? The services and screenings done by your health care provider during your annual well check will depend on your age, overall health, lifestyle risk factors, and family history of disease. Counseling Your health care provider may ask you questions about your:  Alcohol use.  Tobacco use.  Drug use.  Emotional well-being.  Home and relationship well-being.  Sexual activity.  Eating habits.  Work and work Statistician.  Method of birth control.  Menstrual cycle.  Pregnancy history.  Screening You may have the following tests or measurements:  Height, weight, and BMI.  Blood pressure.  Lipid and cholesterol levels. These may be checked every 5 years, or more frequently if you are over 43 years old.  Skin check.  Lung cancer screening. You may have this screening every year starting at age 53 if you have a 30-pack-year history of smoking and currently smoke or have quit within the past 15 years.  Fecal occult blood test (FOBT) of the stool. You may have this test every year starting at age 25.  Flexible sigmoidoscopy or colonoscopy. You may have a sigmoidoscopy every 5 years or a colonoscopy every 10 years starting at age 25.  Hepatitis C blood test.  Hepatitis B blood test.  Sexually transmitted disease (STD) testing.  Diabetes screening. This is done by checking your blood sugar  (glucose) after you have not eaten for a while (fasting). You may have this done every 1-3 years.  Mammogram. This may be done every 1-2 years. Talk to your health care provider about when you should start having regular mammograms. This may depend on whether you have a family history of breast cancer.  BRCA-related cancer screening. This may be done if you have a family history of breast, ovarian, tubal, or peritoneal cancers.  Pelvic exam and Pap test. This may be done every 3 years starting at age 55. Starting at age 20, this may be done every 5 years if you have a Pap test in combination with an HPV test.  Bone density scan. This is done to screen for osteoporosis. You may have this scan if you are at high risk for osteoporosis.  Discuss your test results, treatment options, and if necessary, the need for more tests with your health care provider. Vaccines Your health care provider may recommend certain vaccines, such as:  Influenza vaccine. This is recommended every year.  Tetanus, diphtheria, and acellular pertussis (Tdap, Td) vaccine. You may need a Td booster every 10 years.  Varicella vaccine. You may need this if you have not been vaccinated.  Zoster  vaccine. You may need this after age 68.  Measles, mumps, and rubella (MMR) vaccine. You may need at least one dose of MMR if you were born in 1957 or later. You may also need a second dose.  Pneumococcal 13-valent conjugate (PCV13) vaccine. You may need this if you have certain conditions and were not previously vaccinated.  Pneumococcal polysaccharide (PPSV23) vaccine. You may need one or two doses if you smoke cigarettes or if you have certain conditions.  Meningococcal vaccine. You may need this if you have certain conditions.  Hepatitis A vaccine. You may need this if you have certain conditions or if you travel or work in places where you may be exposed to hepatitis A.  Hepatitis B vaccine. You may need this if you have  certain conditions or if you travel or work in places where you may be exposed to hepatitis B.  Haemophilus influenzae type b (Hib) vaccine. You may need this if you have certain conditions.  Talk to your health care provider about which screenings and vaccines you need and how often you need them. This information is not intended to replace advice given to you by your health care provider. Make sure you discuss any questions you have with your health care provider. Document Released: 02/27/2015 Document Revised: 10/21/2015 Document Reviewed: 12/02/2014 Elsevier Interactive Patient Education  2017 [employer].

## 2017-01-17 NOTE — Progress Notes (Signed)
ANNUAL PREVENTATIVE CARE GYN  ENCOUNTER NOTE  Subjective:       Bridget Washington is a 51 y.o. G37P3003 female here for a routine annual gynecologic exam.  Current complaints: 1.  Dyspareunia in female  Denies difficulty breathing or respiratory distress, chest pain, abdominal pain, vaginal bleeding, dysuria, and leg pain or swelling.   History significant for chronic pain, polyneuropathy, smoker with asthma, and marijuana use  PCP: A. Elijio Miles, MD   Gynecologic History  No LMP recorded. Patient is not currently having periods (Reason: IUD).  Contraception: IUD, Mirena placed approximately 10 years ago  Last Pap: a few years ago. Results were: normal. History of abnormal pap and colposcopy  Last mammogram: 03/21/2016. Results were: normal  Obstetric History  OB History  Gravida Para Term Preterm AB Living  3 3 3     3   SAB TAB Ectopic Multiple Live Births          3    # Outcome Date GA Lbr Len/2nd Weight Sex Delivery Anes PTL Lv  3 Term 1987 [redacted]w[redacted]d   M Vag-Spont   LIV  2 Term 1985 [redacted]w[redacted]d   F Vag-Spont   LIV  1 Term 13 [redacted]w[redacted]d   M Vag-Spont   LIV      Past Medical History:  Diagnosis Date  . Amenorrhea   . Asthma   . CAD (coronary artery disease)   . Cancer of skin    rt thigh, basal cell on chest  . Chest pain   . Cholelithiasis   . Chronic pain   . Current smoker   . Difficulty breathing   . Generalized anxiety disorder   . Hematuria   . Polyneuropathy   . Secondary insomnia     Past Surgical History:  Procedure Laterality Date  . COLONOSCOPY  09/12/2006  . COLONOSCOPY  09/08/2014  . INTRAUTERINE DEVICE INSERTION  2008    Current Outpatient Medications on File Prior to Visit  Medication Sig Dispense Refill  . fentaNYL (DURAGESIC - DOSED MCG/HR) 25 MCG/HR patch Place 25 mcg onto the skin every 3 (three) days.  0  . gabapentin (NEURONTIN) 800 MG tablet Take 800 mg by mouth 4 (four) times daily.  3  . traMADol (ULTRAM) 50 MG tablet Take 50 mg by mouth 3  (three) times daily as needed.  0   No current facility-administered medications on file prior to visit.     No Known Allergies  Social History   Socioeconomic History  . Marital status: Single    Spouse name: Not on file  . Number of children: Not on file  . Years of education: Not on file  . Highest education level: Not on file  Social Needs  . Financial resource strain: Not on file  . Food insecurity - worry: Not on file  . Food insecurity - inability: Not on file  . Transportation needs - medical: Not on file  . Transportation needs - non-medical: Not on file  Occupational History  . Not on file  Tobacco Use  . Smoking status: Current Every Day Smoker  . Smokeless tobacco: Never Used  Substance and Sexual Activity  . Alcohol use: No    Alcohol/week: 0.0 oz  . Drug use: Yes    Types: Marijuana  . Sexual activity: Yes    Birth control/protection: IUD  Other Topics Concern  . Not on file  Social History Narrative  . Not on file    Family History  Problem Relation Age  of Onset  . Breast cancer Mother   . Heart disease Mother   . Hypertension Mother   . Heart disease Father   . Hypertension Father   . Hypertension Maternal Grandmother   . Breast cancer Maternal Grandmother   . Hypertension Maternal Grandfather   . Hypertension Paternal Grandmother   . Hypertension Paternal Grandfather     The following portions of the patient's history were reviewed and updated as appropriate: allergies, current medications, past family history, past medical history, past social history, past surgical history and problem list.  Review of Systems  ROS negative except as noted above. Information obtained from patient.    Objective:   BP 125/72   Pulse 60   Ht 5' 5.5" (1.664 m)   Wt 126 lb 8 oz (57.4 kg)   BMI 20.73 kg/m    CONSTITUTIONAL: Well-developed, well-nourished female in no acute distress.   PSYCHIATRIC: Normal mood and affect. Normal behavior. Normal  judgment and thought content.  [place]: Alert and oriented to person, place, and time. Normal muscle tone coordination. No cranial nerve deficit noted.  HENT:  Normocephalic, atraumatic, External right and left ear normal. Oropharynx is clear and moist  EYES: Conjunctivae and EOM are normal. Pupils are equal, round, and reactive to light. No scleral icterus.   NECK: Normal range of motion, supple, no masses.  Normal thyroid.   SKIN: Skin is warm and dry. No rash noted. Not diaphoretic. No erythema. No pallor.  CARDIOVASCULAR: Normal heart rate noted, regular rhythm, no murmur.  RESPIRATORY: Clear to auscultation bilaterally. Effort and breath sounds normal, no problems with respiration noted.  BREASTS: Symmetric in size. No masses, skin changes, nipple drainage, or lymphadenopathy.  ABDOMEN: Soft, normal bowel sounds, no distention noted.  No tenderness, rebound or guarding.   PELVIC:  External Genitalia: Normal  Vagina: Normal  Cervix: Normal, IUD string present (long)  Uterus: Normal  Adnexa: Normal   MUSCULOSKELETAL: Normal range of motion. No tenderness.  No cyanosis, clubbing, or edema.  2+ distal pulses.  LYMPHATIC: No Axillary, Supraclavicular, or Inguinal Adenopathy.  Assessment:   Annual gynecologic examination 51 y.o.   Contraception: IUD, Mirena   Normal BMI   Problem List Items Addressed This Visit      Other   Marijuana user    Other Visit Diagnoses    Hx of abnormal cervical Pap smear    -  Primary   Relevant Orders   Pap IG and HPV (high risk) DNA detection   Vaginal discharge       Relevant Orders   NuSwab Vaginitis Plus (VG+)   Dyspareunia in female       Relevant Orders   NuSwab Vaginitis Plus (VG+)      Plan:   Pap: Pap Co Test  Mammogram: Not Indicated  Stool Guaiac Testing:  Not Ordered, Followed by PCP   Labs: Not ordered, Followed by PCP  Routine preventative health maintenance measures emphasized: Exercise/Diet/Weight  control, Tobacco Warnings, Alcohol/Substance use risks, Stress Management and Peer Pressure Issues  Reviewed red flag symptoms and when to call.   RTC x 1-2 weeks for Mirena removal and reinsertion.   RTC x 1 year for annual exam or sooner if needed.    Diona Fanti, CNM [facility], [facility]

## 2017-01-19 LAB — PAP IG AND HPV HIGH-RISK
HPV, HIGH-RISK: NEGATIVE
PAP Smear Comment: 0

## 2017-01-19 NOTE — Progress Notes (Signed)
Please contact pt, unable to evaluate Pap will repeat at next visit. HPV negative. Encourage to activate MyChart. JML

## 2017-01-20 LAB — NUSWAB VAGINITIS PLUS (VG+)
CANDIDA GLABRATA, NAA: NEGATIVE
Candida albicans, NAA: NEGATIVE
Chlamydia trachomatis, NAA: NEGATIVE
Neisseria gonorrhoeae, NAA: NEGATIVE
TRICH VAG BY NAA: NEGATIVE

## 2017-01-24 ENCOUNTER — Ambulatory Visit (INDEPENDENT_AMBULATORY_CARE_PROVIDER_SITE_OTHER): Payer: PPO | Admitting: Certified Nurse Midwife

## 2017-01-24 ENCOUNTER — Encounter: Payer: PPO | Admitting: Certified Nurse Midwife

## 2017-01-24 VITALS — BP 126/66 | HR 77 | Ht 65.5 in | Wt 129.3 lb

## 2017-01-24 DIAGNOSIS — R87615 Unsatisfactory cytologic smear of cervix: Secondary | ICD-10-CM

## 2017-01-24 DIAGNOSIS — Z30432 Encounter for removal of intrauterine contraceptive device: Secondary | ICD-10-CM

## 2017-01-24 NOTE — Progress Notes (Signed)
Bridget Washington is a 51 y.o. year old G52P3003 Caucasian female who presents for removal of a Mirena IUD. Her Mirena IUD was placed approximately 10 years ago.   No LMP recorded. Patient is not currently having periods (Reason: IUD).   BP 126/66   Pulse 77   Ht 5' 5.5" (1.664 m)   Wt 129 lb 5 oz (58.7 kg)   BMI 21.19 kg/m   Time out was performed.  A medium speculum was placed in the vagina.  A repeat thin prep Pap was collected.The cervix was visualized, and the strings were visible. They were grasped and the Mirena was easily removed intact without complications.   F/U as needed.   Diona Fanti, CNM

## 2017-01-24 NOTE — Progress Notes (Signed)
Pt is here for an IUD removal.

## 2017-02-01 LAB — PAPIG, HPV, RFX 16/18
HPV GENOTYPE, 16: NEGATIVE
HPV GENOTYPE, 18: NEGATIVE
HPV, HIGH-RISK: POSITIVE — AB
PAP SMEAR COMMENT: 0

## 2017-02-03 DIAGNOSIS — Z1321 Encounter for screening for nutritional disorder: Secondary | ICD-10-CM | POA: Diagnosis not present

## 2017-02-03 DIAGNOSIS — G629 Polyneuropathy, unspecified: Secondary | ICD-10-CM | POA: Diagnosis not present

## 2017-02-03 DIAGNOSIS — G8929 Other chronic pain: Secondary | ICD-10-CM | POA: Diagnosis not present

## 2017-02-03 DIAGNOSIS — Z13220 Encounter for screening for lipoid disorders: Secondary | ICD-10-CM | POA: Diagnosis not present

## 2017-02-03 DIAGNOSIS — J45909 Unspecified asthma, uncomplicated: Secondary | ICD-10-CM | POA: Diagnosis not present

## 2017-02-03 DIAGNOSIS — Z8639 Personal history of other endocrine, nutritional and metabolic disease: Secondary | ICD-10-CM | POA: Diagnosis not present

## 2017-02-03 DIAGNOSIS — Z0000 Encounter for general adult medical examination without abnormal findings: Secondary | ICD-10-CM | POA: Diagnosis not present

## 2017-02-03 DIAGNOSIS — Z1329 Encounter for screening for other suspected endocrine disorder: Secondary | ICD-10-CM | POA: Diagnosis not present

## 2017-03-16 ENCOUNTER — Ambulatory Visit: Payer: PPO | Admitting: Family Medicine

## 2017-04-18 DIAGNOSIS — G8929 Other chronic pain: Secondary | ICD-10-CM | POA: Diagnosis not present

## 2017-04-18 DIAGNOSIS — J45909 Unspecified asthma, uncomplicated: Secondary | ICD-10-CM | POA: Diagnosis not present

## 2017-04-18 DIAGNOSIS — R399 Unspecified symptoms and signs involving the genitourinary system: Secondary | ICD-10-CM | POA: Diagnosis not present

## 2017-04-18 DIAGNOSIS — G629 Polyneuropathy, unspecified: Secondary | ICD-10-CM | POA: Diagnosis not present

## 2017-04-18 DIAGNOSIS — F17210 Nicotine dependence, cigarettes, uncomplicated: Secondary | ICD-10-CM | POA: Diagnosis not present

## 2017-04-18 DIAGNOSIS — Z5181 Encounter for therapeutic drug level monitoring: Secondary | ICD-10-CM | POA: Diagnosis not present

## 2017-07-12 DIAGNOSIS — R05 Cough: Secondary | ICD-10-CM | POA: Diagnosis not present

## 2017-07-12 DIAGNOSIS — J45909 Unspecified asthma, uncomplicated: Secondary | ICD-10-CM | POA: Diagnosis not present

## 2017-07-12 DIAGNOSIS — F419 Anxiety disorder, unspecified: Secondary | ICD-10-CM | POA: Diagnosis not present

## 2017-07-12 DIAGNOSIS — G8929 Other chronic pain: Secondary | ICD-10-CM | POA: Diagnosis not present

## 2017-07-12 DIAGNOSIS — G629 Polyneuropathy, unspecified: Secondary | ICD-10-CM | POA: Diagnosis not present

## 2017-08-10 DIAGNOSIS — K0889 Other specified disorders of teeth and supporting structures: Secondary | ICD-10-CM | POA: Diagnosis not present

## 2017-08-10 DIAGNOSIS — F419 Anxiety disorder, unspecified: Secondary | ICD-10-CM | POA: Diagnosis not present

## 2017-08-10 DIAGNOSIS — R0602 Shortness of breath: Secondary | ICD-10-CM | POA: Diagnosis not present

## 2017-08-10 DIAGNOSIS — G8929 Other chronic pain: Secondary | ICD-10-CM | POA: Diagnosis not present

## 2017-08-10 DIAGNOSIS — F17210 Nicotine dependence, cigarettes, uncomplicated: Secondary | ICD-10-CM | POA: Diagnosis not present

## 2017-08-10 DIAGNOSIS — R5383 Other fatigue: Secondary | ICD-10-CM | POA: Diagnosis not present

## 2017-08-10 DIAGNOSIS — R002 Palpitations: Secondary | ICD-10-CM | POA: Diagnosis not present

## 2017-08-10 DIAGNOSIS — R0609 Other forms of dyspnea: Secondary | ICD-10-CM | POA: Diagnosis not present

## 2017-08-31 DIAGNOSIS — R0602 Shortness of breath: Secondary | ICD-10-CM | POA: Diagnosis not present

## 2017-09-11 DIAGNOSIS — G629 Polyneuropathy, unspecified: Secondary | ICD-10-CM | POA: Diagnosis not present

## 2017-09-11 DIAGNOSIS — R002 Palpitations: Secondary | ICD-10-CM | POA: Diagnosis not present

## 2017-09-11 DIAGNOSIS — F17210 Nicotine dependence, cigarettes, uncomplicated: Secondary | ICD-10-CM | POA: Diagnosis not present

## 2017-09-18 DIAGNOSIS — C44121 Squamous cell carcinoma of skin of unspecified eyelid, including canthus: Secondary | ICD-10-CM | POA: Diagnosis not present

## 2017-09-18 DIAGNOSIS — D485 Neoplasm of uncertain behavior of skin: Secondary | ICD-10-CM | POA: Diagnosis not present

## 2017-10-12 DIAGNOSIS — Z5181 Encounter for therapeutic drug level monitoring: Secondary | ICD-10-CM | POA: Diagnosis not present

## 2017-10-19 DIAGNOSIS — F419 Anxiety disorder, unspecified: Secondary | ICD-10-CM | POA: Diagnosis not present

## 2017-10-19 DIAGNOSIS — G629 Polyneuropathy, unspecified: Secondary | ICD-10-CM | POA: Diagnosis not present

## 2017-10-19 DIAGNOSIS — Z1321 Encounter for screening for nutritional disorder: Secondary | ICD-10-CM | POA: Diagnosis not present

## 2017-10-19 DIAGNOSIS — G8929 Other chronic pain: Secondary | ICD-10-CM | POA: Diagnosis not present

## 2017-10-19 DIAGNOSIS — R002 Palpitations: Secondary | ICD-10-CM | POA: Diagnosis not present

## 2017-10-19 DIAGNOSIS — R413 Other amnesia: Secondary | ICD-10-CM | POA: Diagnosis not present

## 2017-10-19 DIAGNOSIS — J45909 Unspecified asthma, uncomplicated: Secondary | ICD-10-CM | POA: Diagnosis not present

## 2017-10-19 DIAGNOSIS — Z0000 Encounter for general adult medical examination without abnormal findings: Secondary | ICD-10-CM | POA: Diagnosis not present

## 2017-11-08 DIAGNOSIS — Z85828 Personal history of other malignant neoplasm of skin: Secondary | ICD-10-CM | POA: Diagnosis not present

## 2017-11-08 DIAGNOSIS — C441292 Squamous cell carcinoma of skin of left lower eyelid, including canthus: Secondary | ICD-10-CM | POA: Diagnosis not present

## 2017-12-12 IMAGING — US US EXTREM UP*L* LTD
1 series · 14 of 18 positions shown · non-contrast
Comparison: None

CLINICAL DATA: Localized swelling, lump in the left upper arm

EXAM:
ULTRASOUND left UPPER EXTREMITY LIMITED
TECHNIQUE: Ultrasound examination of the upper extremity soft tissues was
performed in the area of clinical concern.

[Series 1: us extrem up*left* ltd · 0.07mm/px · 14 of 18 slices shown]
[im 1/18]
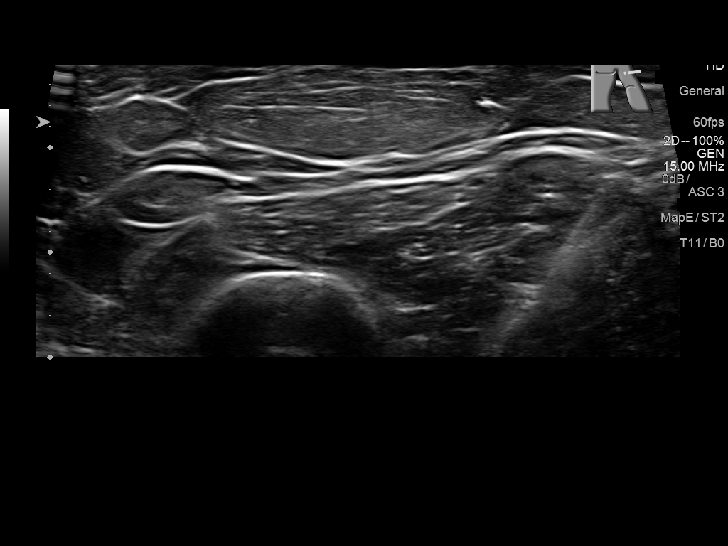
[im 2/18]
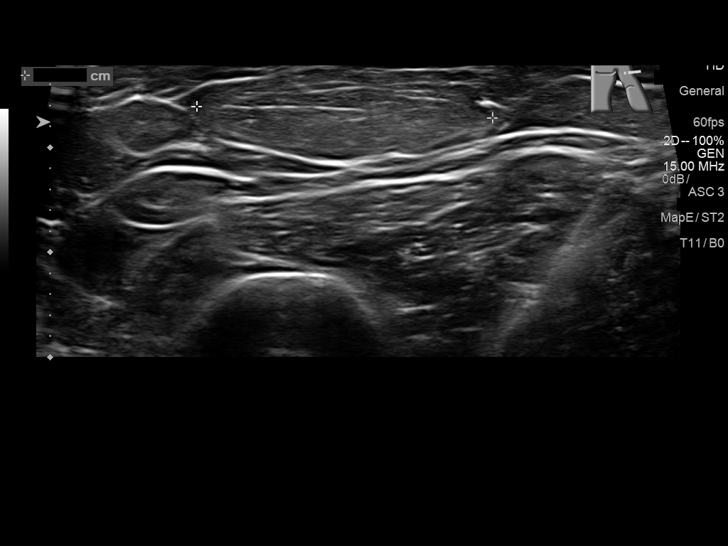
[im 4/18]
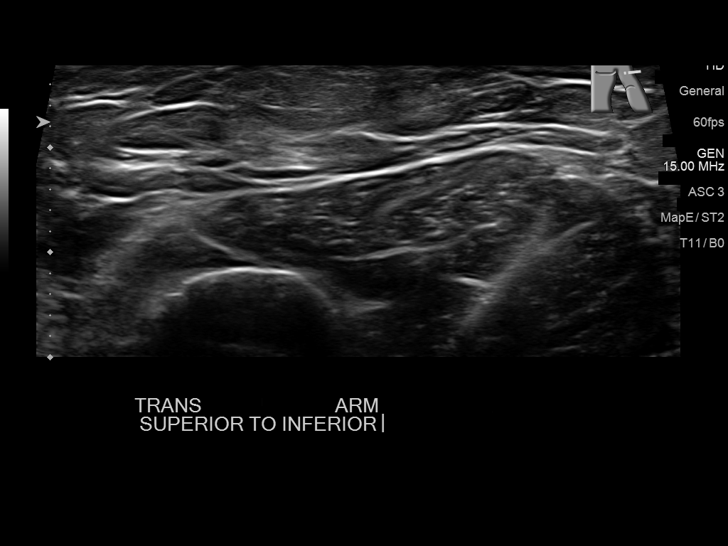
[im 5/18]
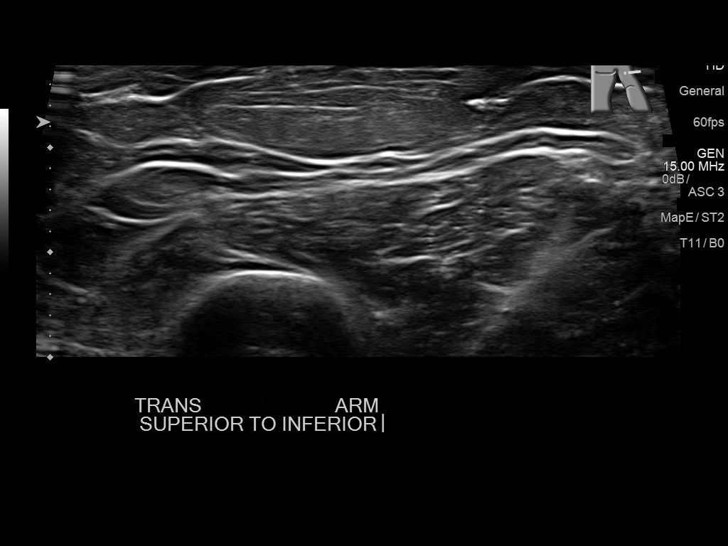
[im 6/18]
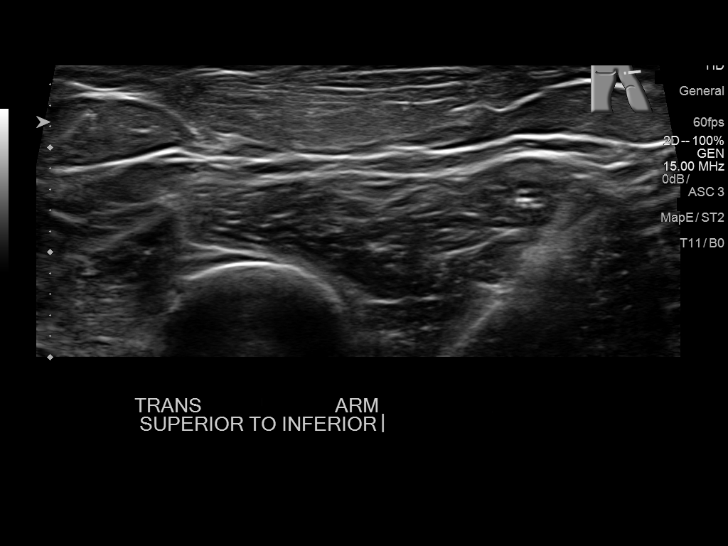
[im 8/18]
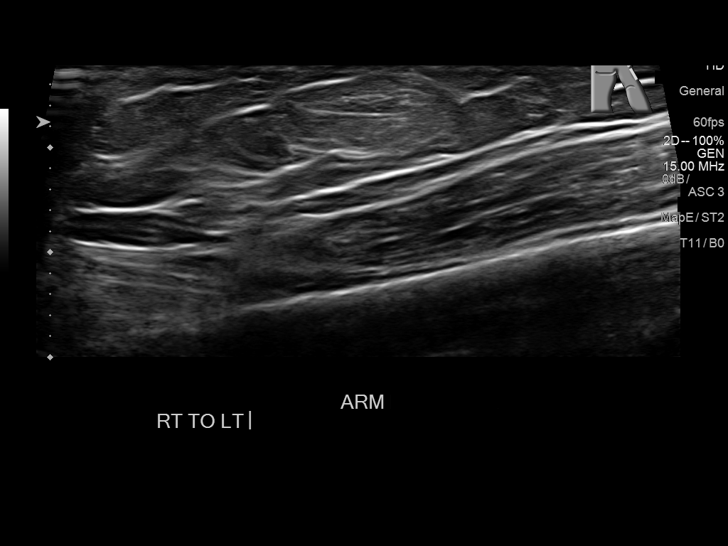
[im 9/18]
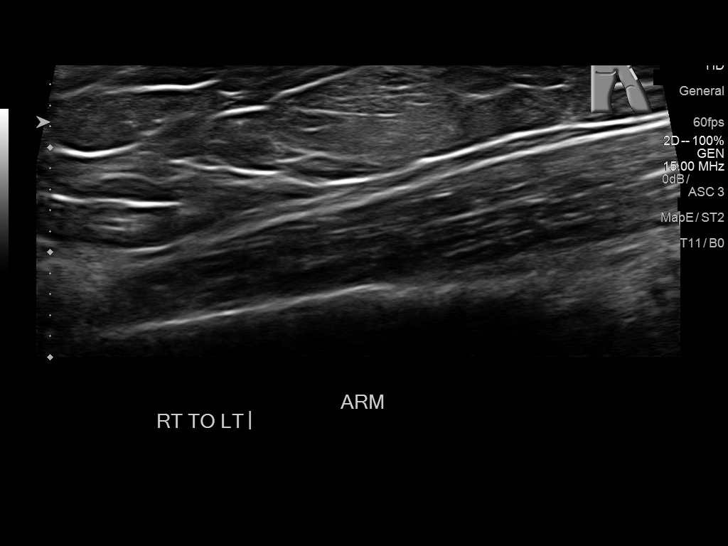
[im 10/18]
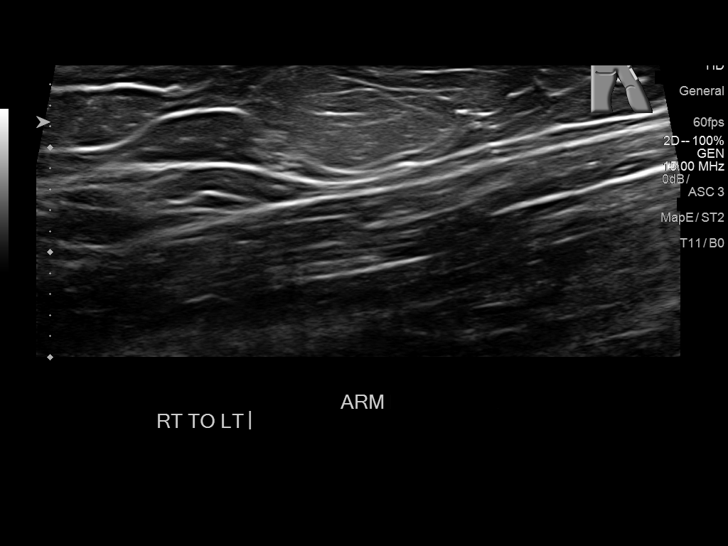
[im 11/18]
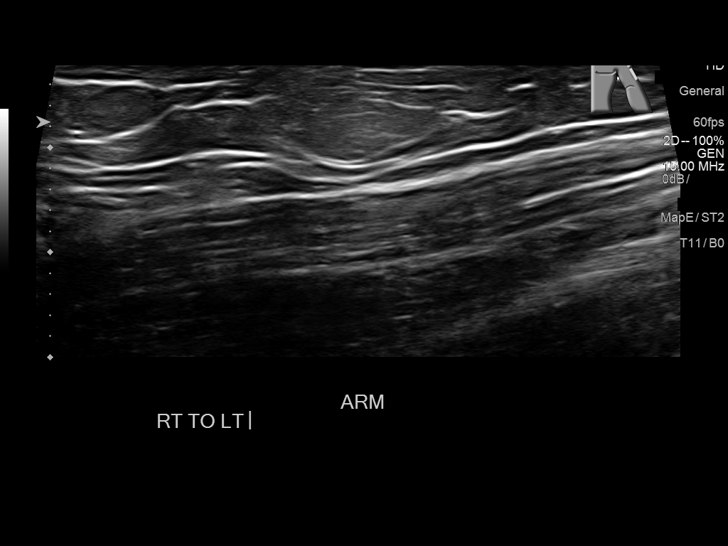
[im 13/18]
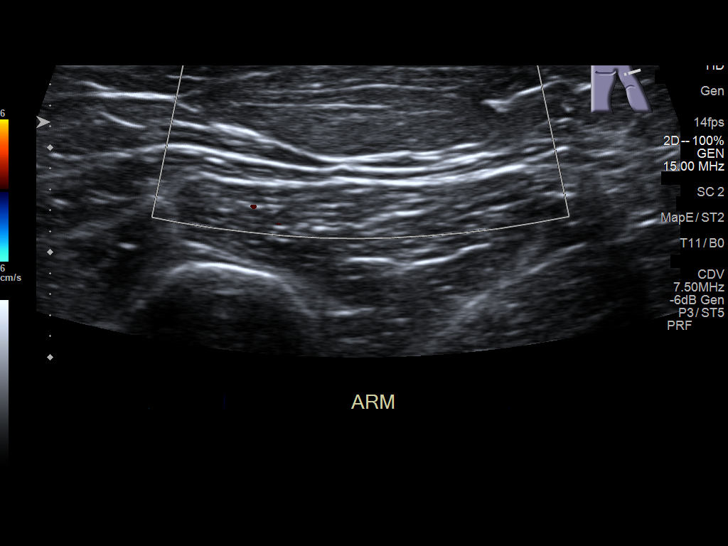
[im 14/18]
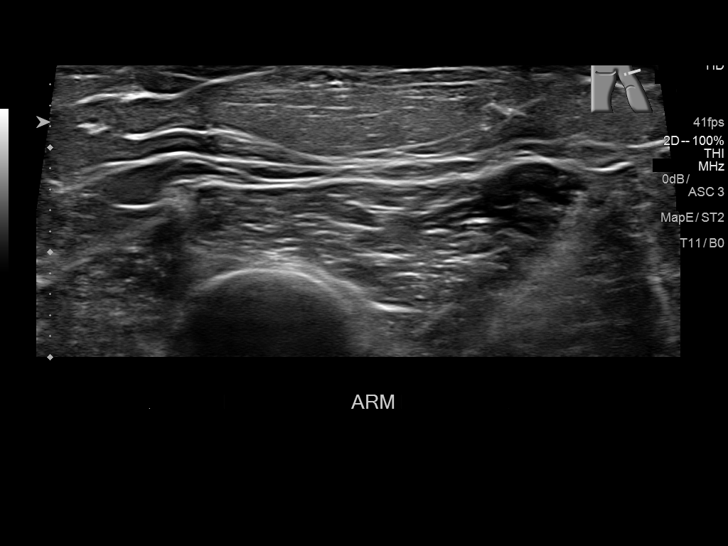
[im 15/18]
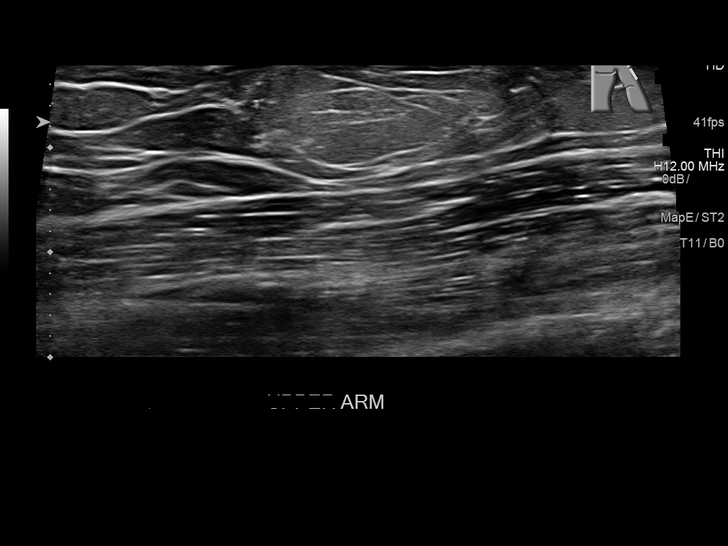
[im 17/18]
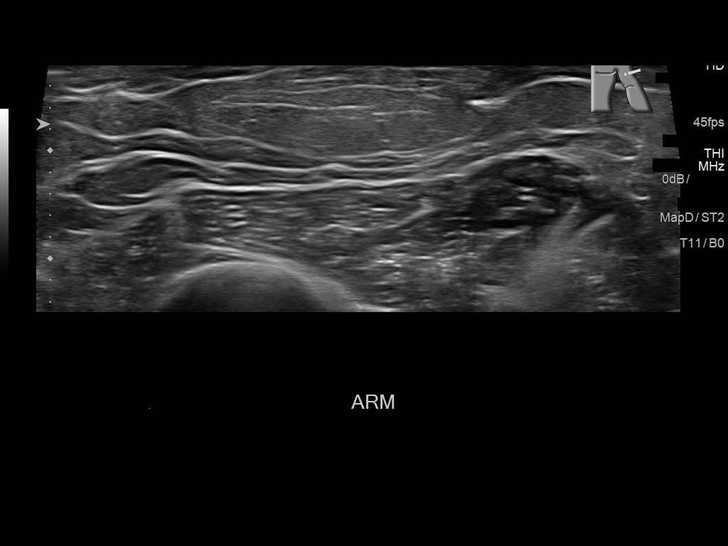
[im 18/18]
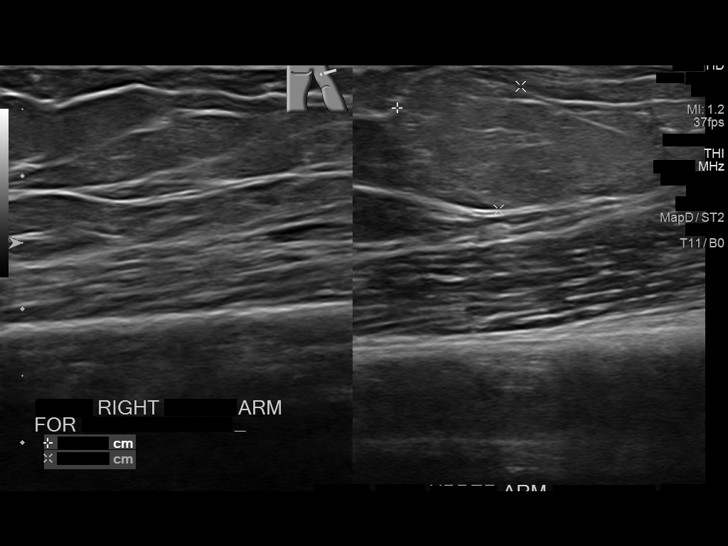

[14 of 18 positions shown; findings below may reference images not displayed]

FINDINGS: Corresponding to the palpable region, there is a solid subcutaneous
structure isoechoic to the surrounding adipose tissue measured, with
measurement of 2.8 by 2.6 by 1.0 cm.
IMPRESSION: 1. Subcutaneous solid mass at the site of palpable lesion, 2.8 by
2.6 by 1.0 cm, isoechoic to surrounding adipose tissue, which raises
the possibility of lipoma. However, MRI would be more specific for
lipoma. There is no underlying abscess or fluid collection.

## 2018-01-26 DIAGNOSIS — G629 Polyneuropathy, unspecified: Secondary | ICD-10-CM | POA: Diagnosis not present

## 2018-01-26 DIAGNOSIS — F419 Anxiety disorder, unspecified: Secondary | ICD-10-CM | POA: Diagnosis not present

## 2018-01-26 DIAGNOSIS — J45909 Unspecified asthma, uncomplicated: Secondary | ICD-10-CM | POA: Diagnosis not present

## 2018-01-26 DIAGNOSIS — F17210 Nicotine dependence, cigarettes, uncomplicated: Secondary | ICD-10-CM | POA: Diagnosis not present

## 2018-01-26 DIAGNOSIS — G8929 Other chronic pain: Secondary | ICD-10-CM | POA: Diagnosis not present

## 2018-01-26 DIAGNOSIS — R413 Other amnesia: Secondary | ICD-10-CM | POA: Diagnosis not present

## 2018-02-20 DIAGNOSIS — Z85828 Personal history of other malignant neoplasm of skin: Secondary | ICD-10-CM | POA: Diagnosis not present

## 2018-02-20 DIAGNOSIS — L821 Other seborrheic keratosis: Secondary | ICD-10-CM | POA: Diagnosis not present

## 2018-02-20 DIAGNOSIS — D2262 Melanocytic nevi of left upper limb, including shoulder: Secondary | ICD-10-CM | POA: Diagnosis not present

## 2018-02-20 DIAGNOSIS — L570 Actinic keratosis: Secondary | ICD-10-CM | POA: Diagnosis not present

## 2018-02-20 DIAGNOSIS — D2261 Melanocytic nevi of right upper limb, including shoulder: Secondary | ICD-10-CM | POA: Diagnosis not present

## 2018-02-20 DIAGNOSIS — X32XXXA Exposure to sunlight, initial encounter: Secondary | ICD-10-CM | POA: Diagnosis not present

## 2018-02-20 DIAGNOSIS — D225 Melanocytic nevi of trunk: Secondary | ICD-10-CM | POA: Diagnosis not present

## 2018-02-27 IMAGING — US US BREAST*R* LIMITED INC AXILLA
1 series · 7 of 7 positions shown · non-contrast
Comparison: 09/04/2015 and 08/10/2006 mammograms

CLINICAL DATA: 49-year-old female for evaluation of possible right
breast mass on screening mammogram.

EXAM:
2D DIGITAL DIAGNOSTIC RIGHT MAMMOGRAM WITH ADJUNCT TOMO
ULTRASOUND RIGHT BREAST

[Series 1: us breast*right* limited inc axilla · 0.05mm/px · 7 of 7 slices shown]
[im 1/7]
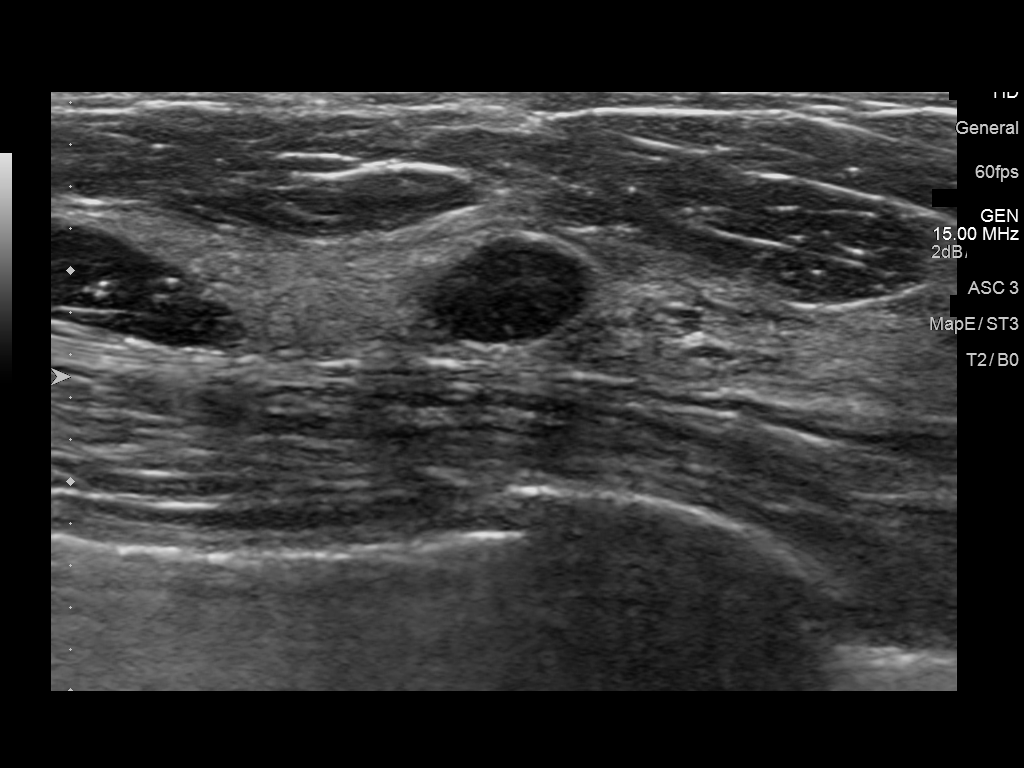
[im 2/7]
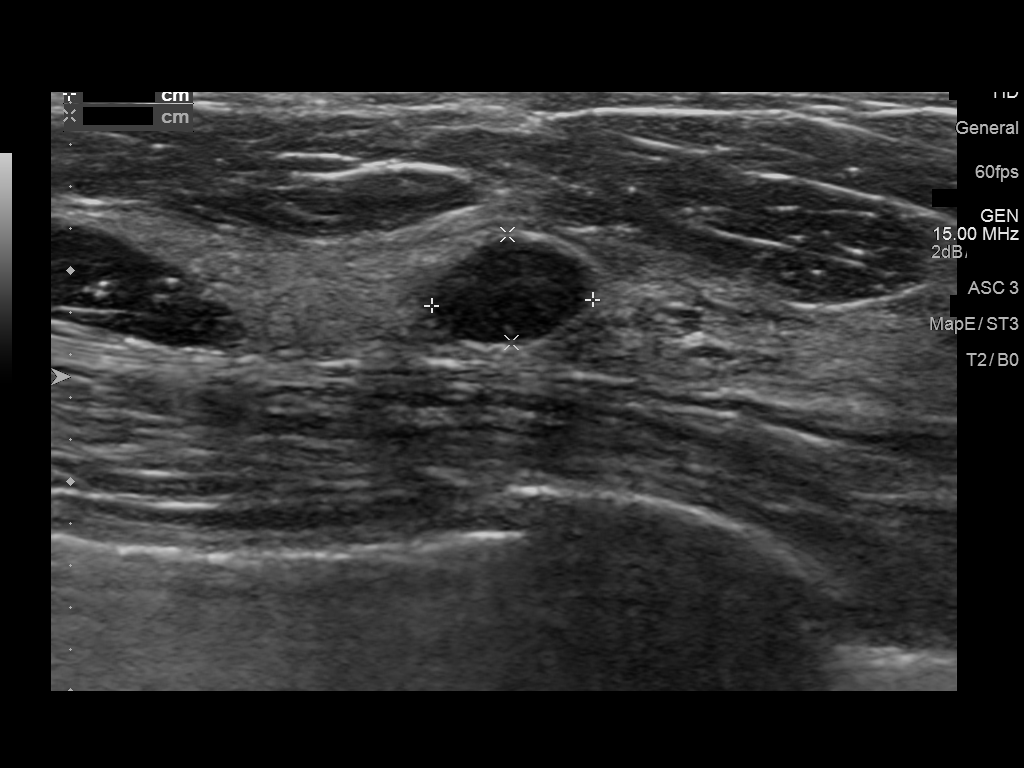
[im 3/7]
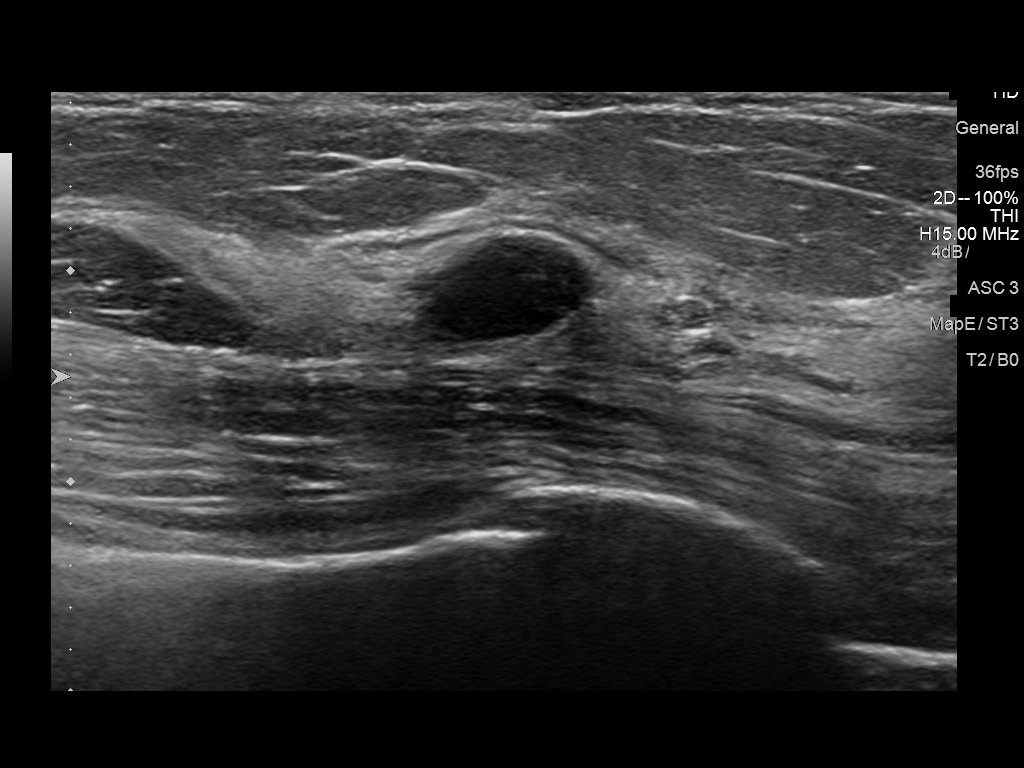
[im 4/7]
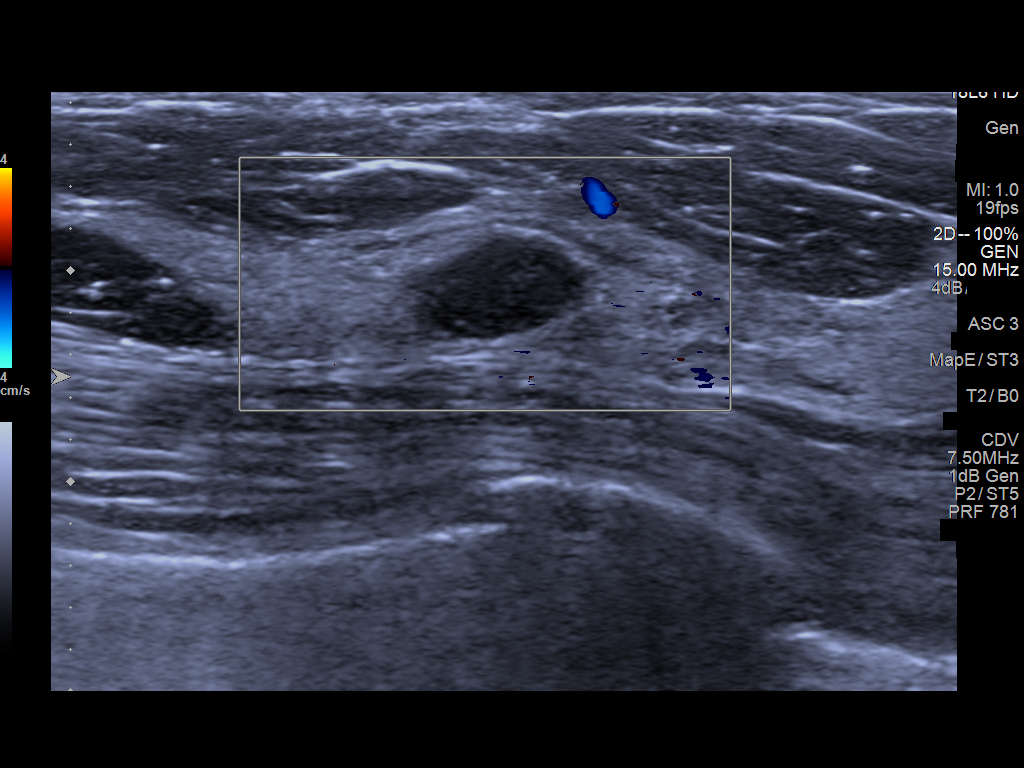
[im 5/7]
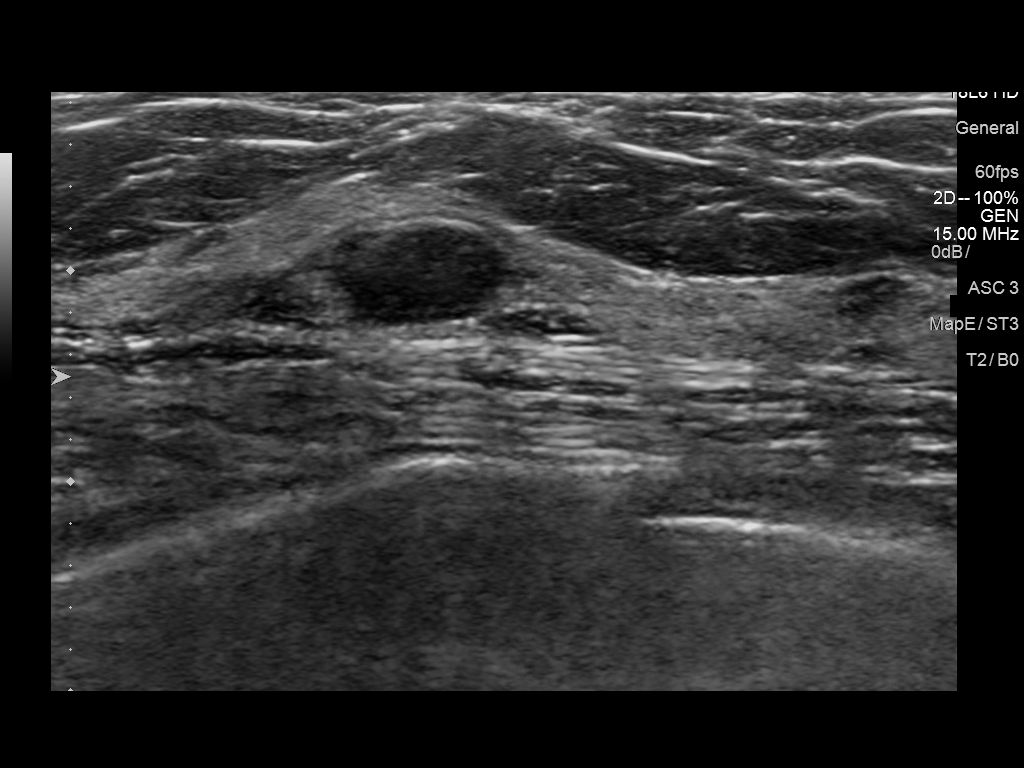
[im 6/7]
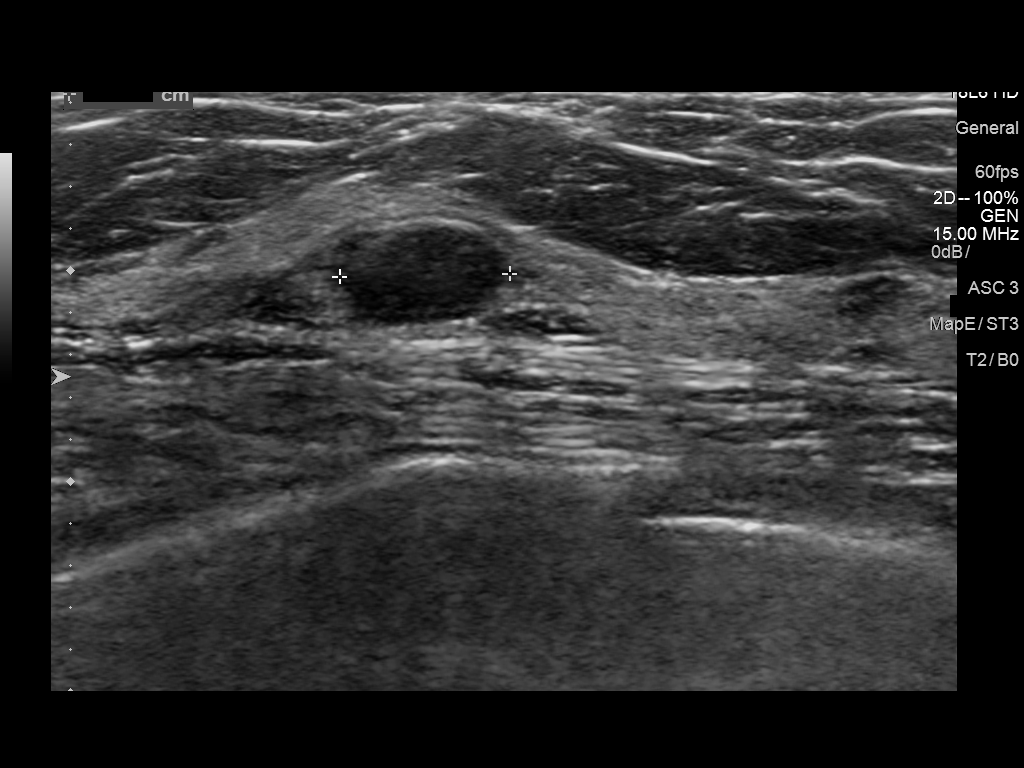
[im 7/7]
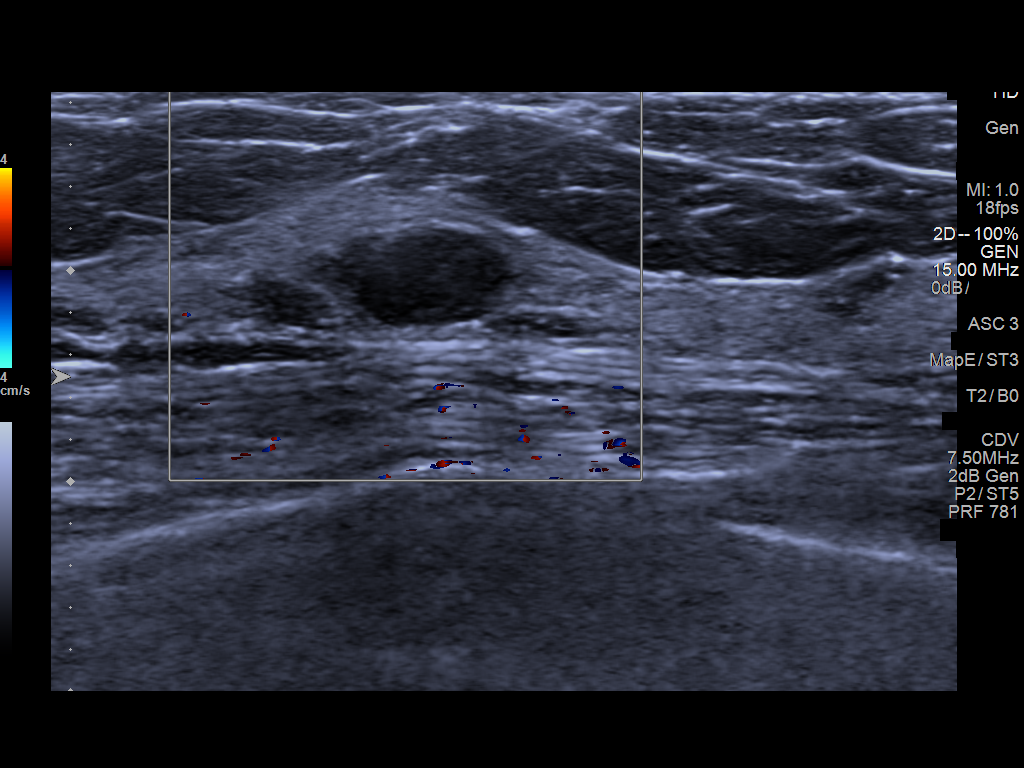

[7 of 7 positions shown; findings below may reference images not displayed]

ACR Breast Density Category c: The breast tissue is heterogeneously
dense, which may obscure small masses.
FINDINGS: 2D and 3D spot compression views of the right breast demonstrate a
circumscribed low-density oval mass within the upper right breast.

Targeted ultrasound is performed, showing a 0.8 x 0.5 x 0.8 cm
circumscribed oval very hypoechoic parallel mass with posterior
acoustic enhancement at the 12 o'clock position of the right breast
4 cm from the nipple corresponding to the mammographic finding and
likely a complicated cyst or fibroadenoma.
IMPRESSION: 0.8 x 0.5 x 0.8 cm likely benign mass in the upper right breast
corresponding to the screening study finding. This probably
represents complicated cyst or fibroadenoma. We discussed management
options including excision, ultrasound-guided core biopsy, and short
term interval follow-up. Follow-up ultrasound is recommended at 6,
12, and 24 months to assess stability. The patient concurs with this
plan.

RECOMMENDATION:
Right breast ultrasound in 6 months.

I have discussed the findings and recommendations with the patient.
Results were also provided in writing at the conclusion of the
visit. If applicable, a reminder letter will be sent to the patient
regarding the next appointment.

BI-RADS CATEGORY  3: Probably benign.

## 2018-04-23 DIAGNOSIS — J45909 Unspecified asthma, uncomplicated: Secondary | ICD-10-CM | POA: Diagnosis not present

## 2018-05-02 DIAGNOSIS — J45909 Unspecified asthma, uncomplicated: Secondary | ICD-10-CM | POA: Diagnosis not present

## 2018-05-02 DIAGNOSIS — G8929 Other chronic pain: Secondary | ICD-10-CM | POA: Diagnosis not present

## 2018-05-02 DIAGNOSIS — Z13220 Encounter for screening for lipoid disorders: Secondary | ICD-10-CM | POA: Diagnosis not present

## 2018-05-02 DIAGNOSIS — G629 Polyneuropathy, unspecified: Secondary | ICD-10-CM | POA: Diagnosis not present

## 2018-05-02 DIAGNOSIS — F17210 Nicotine dependence, cigarettes, uncomplicated: Secondary | ICD-10-CM | POA: Diagnosis not present

## 2018-05-02 DIAGNOSIS — F419 Anxiety disorder, unspecified: Secondary | ICD-10-CM | POA: Diagnosis not present

## 2018-09-02 IMAGING — US US BREAST*R* LIMITED INC AXILLA
1 series · 7 of 7 positions shown · non-contrast
Comparison: Previous exam(s).

CLINICAL DATA: Six-month follow-up for a probably benign right
breast mass.

EXAM:
ULTRASOUND OF THE RIGHT BREAST

[Series 1: us breast*right* limited inc axilla · 0.05mm/px · 7 of 7 slices shown]
[im 1/7]
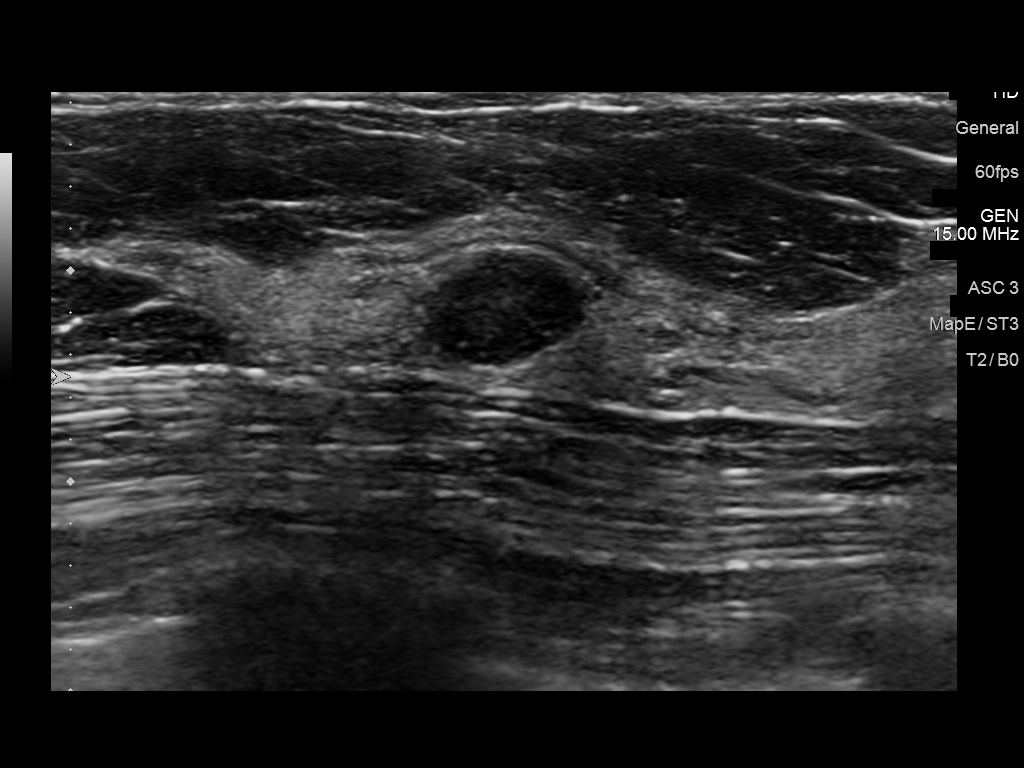
[im 2/7]
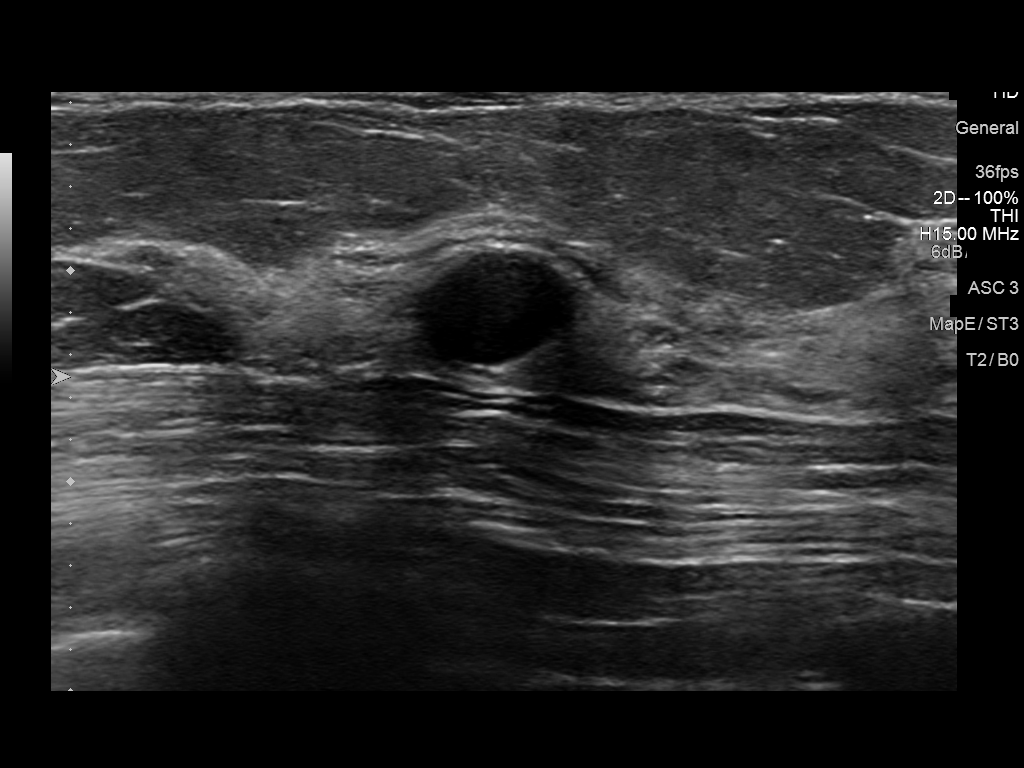
[im 3/7]
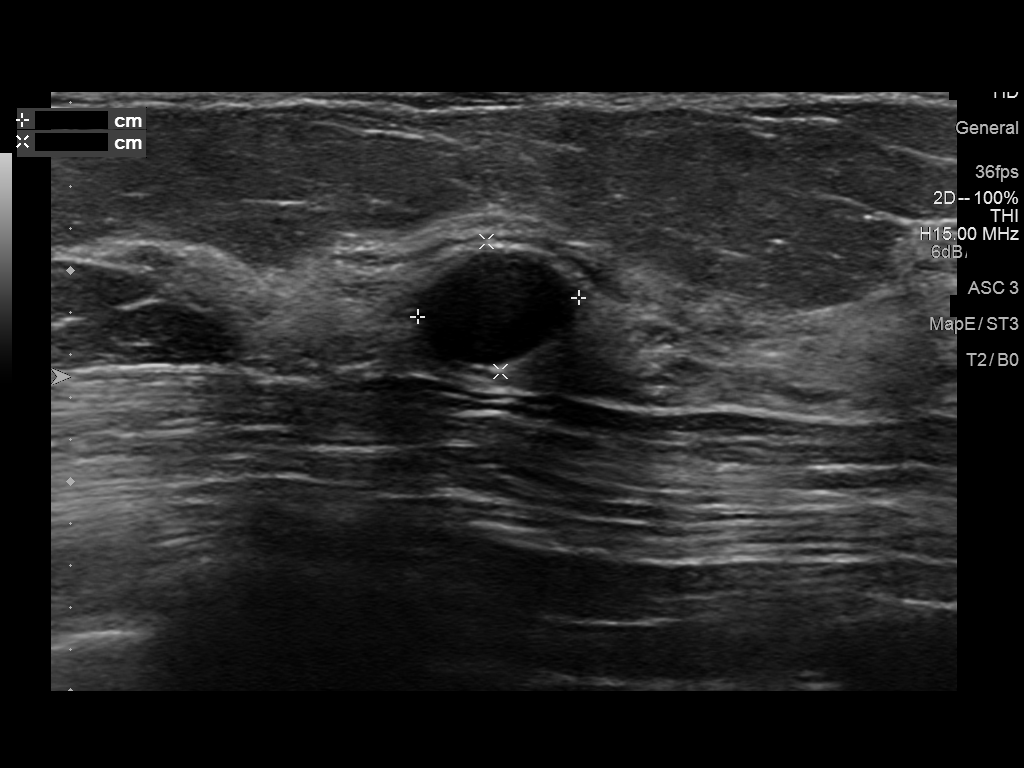
[im 4/7]
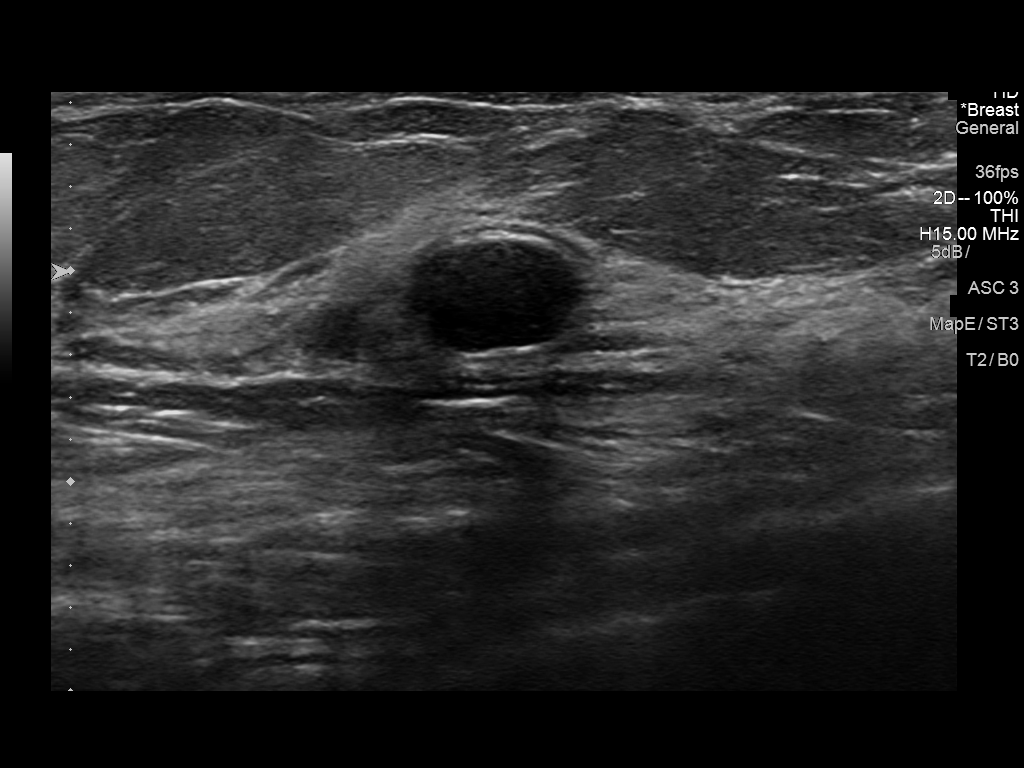
[im 5/7]
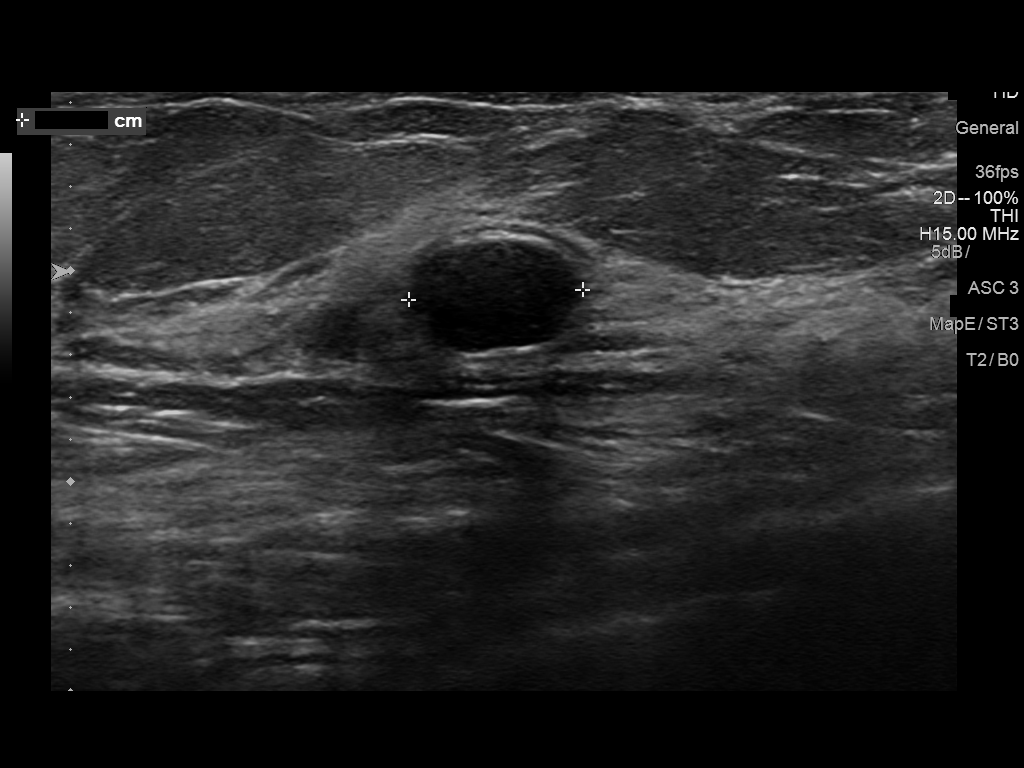
[im 6/7]
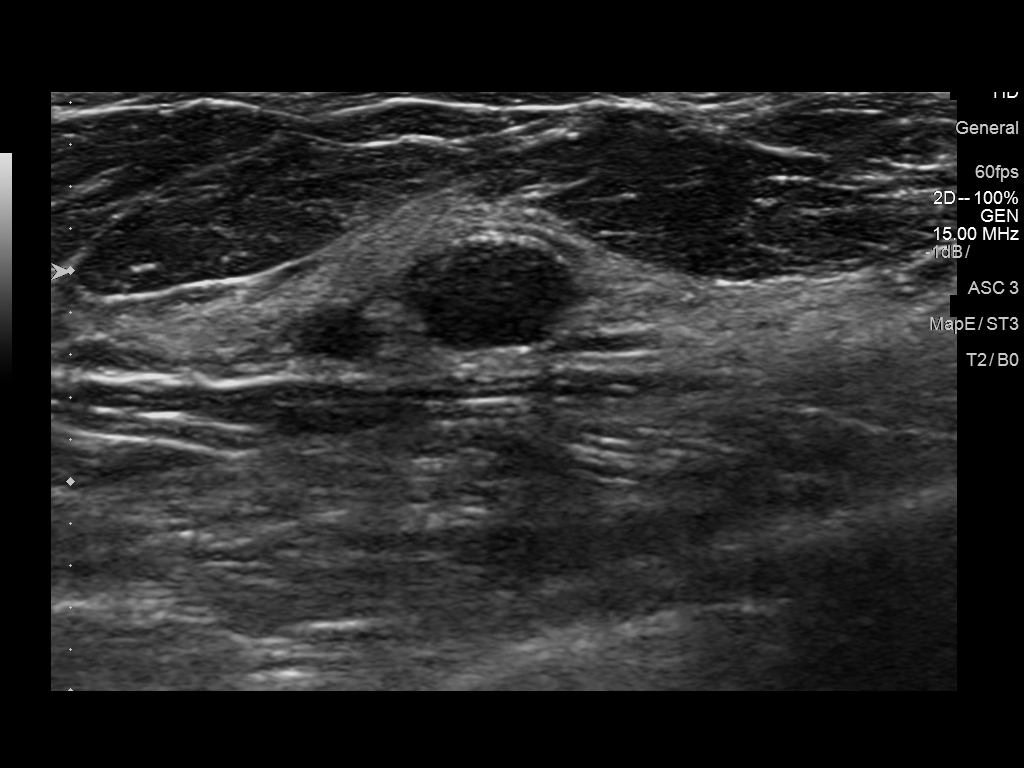
[im 7/7]
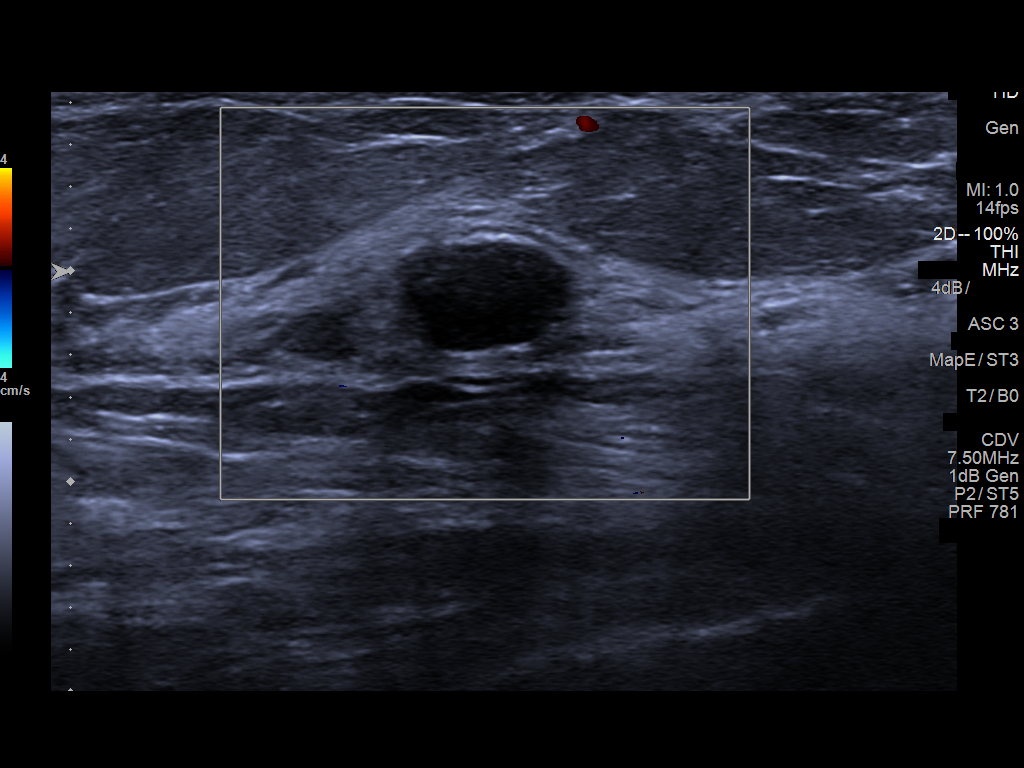

[7 of 7 positions shown; findings below may reference images not displayed]

FINDINGS: Ultrasound targeted to the right breast at 12 o'clock, 4 cm from the
nipple demonstrates a stable oval circumscribed mass measuring 8 x 6
x 8 mm, previously measuring 8 x 5 x 8 mm.
IMPRESSION: The probably benign mass in the right breast at 12 o'clock is
stable.

RECOMMENDATION:
Six-month follow-up bilateral diagnostic mammogram and right breast
ultrasound is recommended appear

I have discussed the findings and recommendations with the patient.
Results were also provided in writing at the conclusion of the
visit. If applicable, a reminder letter will be sent to the patient
regarding the next appointment.

BI-RADS CATEGORY  3: Probably benign.

## 2018-10-26 DIAGNOSIS — J45909 Unspecified asthma, uncomplicated: Secondary | ICD-10-CM | POA: Diagnosis not present

## 2018-10-26 DIAGNOSIS — Z13220 Encounter for screening for lipoid disorders: Secondary | ICD-10-CM | POA: Diagnosis not present

## 2018-11-02 DIAGNOSIS — F17210 Nicotine dependence, cigarettes, uncomplicated: Secondary | ICD-10-CM | POA: Diagnosis not present

## 2018-11-02 DIAGNOSIS — Z0001 Encounter for general adult medical examination with abnormal findings: Secondary | ICD-10-CM | POA: Diagnosis not present

## 2018-11-02 DIAGNOSIS — Z0000 Encounter for general adult medical examination without abnormal findings: Secondary | ICD-10-CM | POA: Diagnosis not present

## 2018-11-02 DIAGNOSIS — J45909 Unspecified asthma, uncomplicated: Secondary | ICD-10-CM | POA: Diagnosis not present

## 2018-11-02 DIAGNOSIS — G629 Polyneuropathy, unspecified: Secondary | ICD-10-CM | POA: Diagnosis not present

## 2018-11-02 DIAGNOSIS — Z1231 Encounter for screening mammogram for malignant neoplasm of breast: Secondary | ICD-10-CM | POA: Diagnosis not present

## 2018-11-02 DIAGNOSIS — G8929 Other chronic pain: Secondary | ICD-10-CM | POA: Diagnosis not present

## 2018-11-02 DIAGNOSIS — F419 Anxiety disorder, unspecified: Secondary | ICD-10-CM | POA: Diagnosis not present

## 2019-03-29 DIAGNOSIS — G629 Polyneuropathy, unspecified: Secondary | ICD-10-CM | POA: Diagnosis not present

## 2019-03-29 DIAGNOSIS — F419 Anxiety disorder, unspecified: Secondary | ICD-10-CM | POA: Diagnosis not present

## 2019-03-29 DIAGNOSIS — G8929 Other chronic pain: Secondary | ICD-10-CM | POA: Diagnosis not present

## 2019-04-22 DIAGNOSIS — J45909 Unspecified asthma, uncomplicated: Secondary | ICD-10-CM | POA: Diagnosis not present

## 2019-05-02 DIAGNOSIS — G8929 Other chronic pain: Secondary | ICD-10-CM | POA: Diagnosis not present

## 2019-05-02 DIAGNOSIS — Z13220 Encounter for screening for lipoid disorders: Secondary | ICD-10-CM | POA: Diagnosis not present

## 2019-05-02 DIAGNOSIS — J45909 Unspecified asthma, uncomplicated: Secondary | ICD-10-CM | POA: Diagnosis not present

## 2019-05-02 DIAGNOSIS — Z0000 Encounter for general adult medical examination without abnormal findings: Secondary | ICD-10-CM | POA: Diagnosis not present

## 2019-05-02 DIAGNOSIS — F419 Anxiety disorder, unspecified: Secondary | ICD-10-CM | POA: Diagnosis not present

## 2019-05-02 DIAGNOSIS — G629 Polyneuropathy, unspecified: Secondary | ICD-10-CM | POA: Diagnosis not present

## 2019-05-02 DIAGNOSIS — F17210 Nicotine dependence, cigarettes, uncomplicated: Secondary | ICD-10-CM | POA: Diagnosis not present

## 2019-10-29 DIAGNOSIS — J45909 Unspecified asthma, uncomplicated: Secondary | ICD-10-CM | POA: Diagnosis not present

## 2019-10-29 DIAGNOSIS — Z13220 Encounter for screening for lipoid disorders: Secondary | ICD-10-CM | POA: Diagnosis not present

## 2019-11-05 DIAGNOSIS — F17210 Nicotine dependence, cigarettes, uncomplicated: Secondary | ICD-10-CM | POA: Diagnosis not present

## 2019-11-05 DIAGNOSIS — F419 Anxiety disorder, unspecified: Secondary | ICD-10-CM | POA: Diagnosis not present

## 2019-11-05 DIAGNOSIS — Z1231 Encounter for screening mammogram for malignant neoplasm of breast: Secondary | ICD-10-CM | POA: Diagnosis not present

## 2019-11-05 DIAGNOSIS — J45909 Unspecified asthma, uncomplicated: Secondary | ICD-10-CM | POA: Diagnosis not present

## 2019-11-05 DIAGNOSIS — G629 Polyneuropathy, unspecified: Secondary | ICD-10-CM | POA: Diagnosis not present

## 2019-11-05 DIAGNOSIS — G8929 Other chronic pain: Secondary | ICD-10-CM | POA: Diagnosis not present

## 2019-11-05 DIAGNOSIS — Z1211 Encounter for screening for malignant neoplasm of colon: Secondary | ICD-10-CM | POA: Diagnosis not present

## 2019-11-05 DIAGNOSIS — Z0001 Encounter for general adult medical examination with abnormal findings: Secondary | ICD-10-CM | POA: Diagnosis not present

## 2019-11-12 ENCOUNTER — Other Ambulatory Visit: Payer: Self-pay | Admitting: Internal Medicine

## 2019-11-12 DIAGNOSIS — Z1231 Encounter for screening mammogram for malignant neoplasm of breast: Secondary | ICD-10-CM

## 2019-11-18 DIAGNOSIS — Z1211 Encounter for screening for malignant neoplasm of colon: Secondary | ICD-10-CM | POA: Diagnosis not present

## 2020-02-04 DIAGNOSIS — G629 Polyneuropathy, unspecified: Secondary | ICD-10-CM | POA: Diagnosis not present

## 2020-02-04 DIAGNOSIS — F17210 Nicotine dependence, cigarettes, uncomplicated: Secondary | ICD-10-CM | POA: Diagnosis not present

## 2020-02-04 DIAGNOSIS — F419 Anxiety disorder, unspecified: Secondary | ICD-10-CM | POA: Diagnosis not present

## 2020-02-04 DIAGNOSIS — Z1231 Encounter for screening mammogram for malignant neoplasm of breast: Secondary | ICD-10-CM | POA: Diagnosis not present

## 2020-02-04 DIAGNOSIS — M26629 Arthralgia of temporomandibular joint, unspecified side: Secondary | ICD-10-CM | POA: Diagnosis not present

## 2020-02-04 DIAGNOSIS — G8929 Other chronic pain: Secondary | ICD-10-CM | POA: Diagnosis not present

## 2020-02-04 DIAGNOSIS — J45909 Unspecified asthma, uncomplicated: Secondary | ICD-10-CM | POA: Diagnosis not present

## 2020-02-13 DIAGNOSIS — H60391 Other infective otitis externa, right ear: Secondary | ICD-10-CM | POA: Diagnosis not present

## 2020-02-20 DIAGNOSIS — H6981 Other specified disorders of Eustachian tube, right ear: Secondary | ICD-10-CM | POA: Diagnosis not present

## 2020-02-20 DIAGNOSIS — H6121 Impacted cerumen, right ear: Secondary | ICD-10-CM | POA: Diagnosis not present

## 2020-02-20 DIAGNOSIS — H6501 Acute serous otitis media, right ear: Secondary | ICD-10-CM | POA: Diagnosis not present

## 2020-05-05 DIAGNOSIS — J45909 Unspecified asthma, uncomplicated: Secondary | ICD-10-CM | POA: Diagnosis not present

## 2020-05-12 DIAGNOSIS — Z0000 Encounter for general adult medical examination without abnormal findings: Secondary | ICD-10-CM | POA: Diagnosis not present

## 2020-05-12 DIAGNOSIS — K0510 Chronic gingivitis, plaque induced: Secondary | ICD-10-CM | POA: Diagnosis not present

## 2020-05-12 DIAGNOSIS — J45909 Unspecified asthma, uncomplicated: Secondary | ICD-10-CM | POA: Diagnosis not present

## 2020-05-12 DIAGNOSIS — G8929 Other chronic pain: Secondary | ICD-10-CM | POA: Diagnosis not present

## 2020-05-12 DIAGNOSIS — F419 Anxiety disorder, unspecified: Secondary | ICD-10-CM | POA: Diagnosis not present

## 2020-05-12 DIAGNOSIS — G629 Polyneuropathy, unspecified: Secondary | ICD-10-CM | POA: Diagnosis not present

## 2020-05-12 DIAGNOSIS — F17210 Nicotine dependence, cigarettes, uncomplicated: Secondary | ICD-10-CM | POA: Diagnosis not present

## 2020-06-30 DIAGNOSIS — U071 COVID-19: Secondary | ICD-10-CM | POA: Diagnosis not present

## 2020-07-08 DIAGNOSIS — U071 COVID-19: Secondary | ICD-10-CM | POA: Diagnosis not present

## 2020-08-21 DIAGNOSIS — F17210 Nicotine dependence, cigarettes, uncomplicated: Secondary | ICD-10-CM | POA: Diagnosis not present

## 2020-08-21 DIAGNOSIS — Z8616 Personal history of COVID-19: Secondary | ICD-10-CM | POA: Diagnosis not present

## 2020-08-21 DIAGNOSIS — G6289 Other specified polyneuropathies: Secondary | ICD-10-CM | POA: Diagnosis not present

## 2020-08-21 DIAGNOSIS — G894 Chronic pain syndrome: Secondary | ICD-10-CM | POA: Diagnosis not present

## 2020-11-23 DIAGNOSIS — J45909 Unspecified asthma, uncomplicated: Secondary | ICD-10-CM | POA: Diagnosis not present

## 2020-11-30 DIAGNOSIS — I739 Peripheral vascular disease, unspecified: Secondary | ICD-10-CM | POA: Diagnosis not present

## 2020-11-30 DIAGNOSIS — F17210 Nicotine dependence, cigarettes, uncomplicated: Secondary | ICD-10-CM | POA: Diagnosis not present

## 2020-11-30 DIAGNOSIS — R55 Syncope and collapse: Secondary | ICD-10-CM | POA: Diagnosis not present

## 2020-11-30 DIAGNOSIS — G8929 Other chronic pain: Secondary | ICD-10-CM | POA: Diagnosis not present

## 2020-11-30 DIAGNOSIS — G629 Polyneuropathy, unspecified: Secondary | ICD-10-CM | POA: Diagnosis not present

## 2020-11-30 DIAGNOSIS — Z1231 Encounter for screening mammogram for malignant neoplasm of breast: Secondary | ICD-10-CM | POA: Diagnosis not present

## 2020-11-30 DIAGNOSIS — J45909 Unspecified asthma, uncomplicated: Secondary | ICD-10-CM | POA: Diagnosis not present

## 2020-11-30 DIAGNOSIS — F419 Anxiety disorder, unspecified: Secondary | ICD-10-CM | POA: Diagnosis not present

## 2020-11-30 DIAGNOSIS — Z0001 Encounter for general adult medical examination with abnormal findings: Secondary | ICD-10-CM | POA: Diagnosis not present

## 2020-12-14 DIAGNOSIS — G8929 Other chronic pain: Secondary | ICD-10-CM | POA: Diagnosis not present

## 2020-12-14 DIAGNOSIS — F17210 Nicotine dependence, cigarettes, uncomplicated: Secondary | ICD-10-CM | POA: Diagnosis not present

## 2020-12-14 DIAGNOSIS — R002 Palpitations: Secondary | ICD-10-CM | POA: Diagnosis not present

## 2020-12-14 DIAGNOSIS — R55 Syncope and collapse: Secondary | ICD-10-CM | POA: Diagnosis not present

## 2020-12-14 DIAGNOSIS — F419 Anxiety disorder, unspecified: Secondary | ICD-10-CM | POA: Diagnosis not present

## 2020-12-25 DIAGNOSIS — R55 Syncope and collapse: Secondary | ICD-10-CM | POA: Diagnosis not present

## 2020-12-29 DIAGNOSIS — F419 Anxiety disorder, unspecified: Secondary | ICD-10-CM | POA: Diagnosis not present

## 2020-12-29 DIAGNOSIS — J45909 Unspecified asthma, uncomplicated: Secondary | ICD-10-CM | POA: Diagnosis not present

## 2020-12-29 DIAGNOSIS — R002 Palpitations: Secondary | ICD-10-CM | POA: Diagnosis not present

## 2020-12-29 DIAGNOSIS — F17210 Nicotine dependence, cigarettes, uncomplicated: Secondary | ICD-10-CM | POA: Diagnosis not present

## 2021-01-11 DIAGNOSIS — R002 Palpitations: Secondary | ICD-10-CM | POA: Diagnosis not present

## 2021-01-29 ENCOUNTER — Other Ambulatory Visit: Payer: Self-pay | Admitting: Internal Medicine

## 2021-01-29 DIAGNOSIS — N6310 Unspecified lump in the right breast, unspecified quadrant: Secondary | ICD-10-CM

## 2021-03-04 DIAGNOSIS — F17210 Nicotine dependence, cigarettes, uncomplicated: Secondary | ICD-10-CM | POA: Diagnosis not present

## 2021-03-04 DIAGNOSIS — Z0000 Encounter for general adult medical examination without abnormal findings: Secondary | ICD-10-CM | POA: Diagnosis not present

## 2021-03-04 DIAGNOSIS — F419 Anxiety disorder, unspecified: Secondary | ICD-10-CM | POA: Diagnosis not present

## 2021-03-04 DIAGNOSIS — M25512 Pain in left shoulder: Secondary | ICD-10-CM | POA: Diagnosis not present

## 2021-03-04 DIAGNOSIS — Z1231 Encounter for screening mammogram for malignant neoplasm of breast: Secondary | ICD-10-CM | POA: Diagnosis not present

## 2021-03-04 DIAGNOSIS — G8929 Other chronic pain: Secondary | ICD-10-CM | POA: Diagnosis not present

## 2021-03-24 DIAGNOSIS — M778 Other enthesopathies, not elsewhere classified: Secondary | ICD-10-CM | POA: Diagnosis not present

## 2021-03-24 DIAGNOSIS — M7552 Bursitis of left shoulder: Secondary | ICD-10-CM | POA: Diagnosis not present

## 2021-03-24 DIAGNOSIS — M25512 Pain in left shoulder: Secondary | ICD-10-CM | POA: Diagnosis not present

## 2021-03-24 DIAGNOSIS — M62838 Other muscle spasm: Secondary | ICD-10-CM | POA: Diagnosis not present

## 2021-04-30 DIAGNOSIS — F17210 Nicotine dependence, cigarettes, uncomplicated: Secondary | ICD-10-CM | POA: Diagnosis not present

## 2021-04-30 DIAGNOSIS — G8929 Other chronic pain: Secondary | ICD-10-CM | POA: Diagnosis not present

## 2021-04-30 DIAGNOSIS — G629 Polyneuropathy, unspecified: Secondary | ICD-10-CM | POA: Diagnosis not present

## 2021-05-26 DIAGNOSIS — F419 Anxiety disorder, unspecified: Secondary | ICD-10-CM | POA: Diagnosis not present

## 2021-06-02 DIAGNOSIS — J45909 Unspecified asthma, uncomplicated: Secondary | ICD-10-CM | POA: Diagnosis not present

## 2021-06-02 DIAGNOSIS — G629 Polyneuropathy, unspecified: Secondary | ICD-10-CM | POA: Diagnosis not present

## 2021-06-02 DIAGNOSIS — G8929 Other chronic pain: Secondary | ICD-10-CM | POA: Diagnosis not present

## 2021-06-02 DIAGNOSIS — F17210 Nicotine dependence, cigarettes, uncomplicated: Secondary | ICD-10-CM | POA: Diagnosis not present

## 2021-06-02 DIAGNOSIS — F419 Anxiety disorder, unspecified: Secondary | ICD-10-CM | POA: Diagnosis not present

## 2021-09-03 DIAGNOSIS — F17210 Nicotine dependence, cigarettes, uncomplicated: Secondary | ICD-10-CM | POA: Diagnosis not present

## 2021-09-03 DIAGNOSIS — G8929 Other chronic pain: Secondary | ICD-10-CM | POA: Diagnosis not present

## 2021-09-03 DIAGNOSIS — J45909 Unspecified asthma, uncomplicated: Secondary | ICD-10-CM | POA: Diagnosis not present

## 2021-10-08 DIAGNOSIS — I739 Peripheral vascular disease, unspecified: Secondary | ICD-10-CM | POA: Diagnosis not present

## 2021-11-25 DIAGNOSIS — J45909 Unspecified asthma, uncomplicated: Secondary | ICD-10-CM | POA: Diagnosis not present

## 2021-12-02 DIAGNOSIS — G629 Polyneuropathy, unspecified: Secondary | ICD-10-CM | POA: Diagnosis not present

## 2021-12-02 DIAGNOSIS — Z1231 Encounter for screening mammogram for malignant neoplasm of breast: Secondary | ICD-10-CM | POA: Diagnosis not present

## 2021-12-02 DIAGNOSIS — F17210 Nicotine dependence, cigarettes, uncomplicated: Secondary | ICD-10-CM | POA: Diagnosis not present

## 2021-12-02 DIAGNOSIS — J45909 Unspecified asthma, uncomplicated: Secondary | ICD-10-CM | POA: Diagnosis not present

## 2021-12-02 DIAGNOSIS — G8929 Other chronic pain: Secondary | ICD-10-CM | POA: Diagnosis not present

## 2021-12-02 DIAGNOSIS — Z0001 Encounter for general adult medical examination with abnormal findings: Secondary | ICD-10-CM | POA: Diagnosis not present

## 2022-03-10 DIAGNOSIS — G629 Polyneuropathy, unspecified: Secondary | ICD-10-CM | POA: Diagnosis not present

## 2022-03-10 DIAGNOSIS — G8929 Other chronic pain: Secondary | ICD-10-CM | POA: Diagnosis not present

## 2022-03-10 DIAGNOSIS — F419 Anxiety disorder, unspecified: Secondary | ICD-10-CM | POA: Diagnosis not present

## 2022-03-10 DIAGNOSIS — J45909 Unspecified asthma, uncomplicated: Secondary | ICD-10-CM | POA: Diagnosis not present

## 2022-03-10 DIAGNOSIS — F17210 Nicotine dependence, cigarettes, uncomplicated: Secondary | ICD-10-CM | POA: Diagnosis not present

## 2022-03-10 DIAGNOSIS — Z0000 Encounter for general adult medical examination without abnormal findings: Secondary | ICD-10-CM | POA: Diagnosis not present

## 2022-04-18 ENCOUNTER — Other Ambulatory Visit: Payer: Self-pay | Admitting: Internal Medicine

## 2022-04-18 DIAGNOSIS — Z1231 Encounter for screening mammogram for malignant neoplasm of breast: Secondary | ICD-10-CM

## 2022-05-17 ENCOUNTER — Ambulatory Visit
Admission: RE | Admit: 2022-05-17 | Discharge: 2022-05-17 | Disposition: A | Discharge status: Home / Self Care | Payer: No Typology Code available for payment source | Source: Ambulatory Visit | Attending: Internal Medicine | Admitting: Internal Medicine

## 2022-05-17 DIAGNOSIS — Z1231 Encounter for screening mammogram for malignant neoplasm of breast: Secondary | ICD-10-CM | POA: Diagnosis not present

## 2022-05-23 ENCOUNTER — Other Ambulatory Visit: Payer: Self-pay | Admitting: Internal Medicine

## 2022-05-23 DIAGNOSIS — R928 Other abnormal and inconclusive findings on diagnostic imaging of breast: Secondary | ICD-10-CM

## 2022-05-23 DIAGNOSIS — N63 Unspecified lump in unspecified breast: Secondary | ICD-10-CM

## 2022-05-30 ENCOUNTER — Ambulatory Visit
Admission: RE | Admit: 2022-05-30 | Discharge: 2022-05-30 | Disposition: A | Discharge status: Home / Self Care | Payer: No Typology Code available for payment source | Source: Ambulatory Visit | Attending: Internal Medicine | Admitting: Internal Medicine

## 2022-05-30 DIAGNOSIS — N63 Unspecified lump in unspecified breast: Secondary | ICD-10-CM | POA: Diagnosis not present

## 2022-05-30 DIAGNOSIS — R928 Other abnormal and inconclusive findings on diagnostic imaging of breast: Secondary | ICD-10-CM | POA: Insufficient documentation

## 2022-09-14 DIAGNOSIS — G8929 Other chronic pain: Secondary | ICD-10-CM | POA: Diagnosis not present

## 2022-09-14 DIAGNOSIS — G629 Polyneuropathy, unspecified: Secondary | ICD-10-CM | POA: Diagnosis not present

## 2022-09-14 DIAGNOSIS — F419 Anxiety disorder, unspecified: Secondary | ICD-10-CM | POA: Diagnosis not present

## 2022-12-15 DIAGNOSIS — G8929 Other chronic pain: Secondary | ICD-10-CM | POA: Diagnosis not present

## 2022-12-15 DIAGNOSIS — G629 Polyneuropathy, unspecified: Secondary | ICD-10-CM | POA: Diagnosis not present

## 2022-12-15 DIAGNOSIS — Z23 Encounter for immunization: Secondary | ICD-10-CM | POA: Diagnosis not present

## 2022-12-15 DIAGNOSIS — F419 Anxiety disorder, unspecified: Secondary | ICD-10-CM | POA: Diagnosis not present

## 2022-12-15 DIAGNOSIS — F17210 Nicotine dependence, cigarettes, uncomplicated: Secondary | ICD-10-CM | POA: Diagnosis not present

## 2022-12-15 DIAGNOSIS — J45909 Unspecified asthma, uncomplicated: Secondary | ICD-10-CM | POA: Diagnosis not present

## 2023-01-05 DIAGNOSIS — S0921XA Traumatic rupture of right ear drum, initial encounter: Secondary | ICD-10-CM | POA: Diagnosis not present

## 2023-01-05 DIAGNOSIS — H6691 Otitis media, unspecified, right ear: Secondary | ICD-10-CM | POA: Diagnosis not present

## 2023-02-13 DIAGNOSIS — H6501 Acute serous otitis media, right ear: Secondary | ICD-10-CM | POA: Diagnosis not present

## 2023-02-13 DIAGNOSIS — H6981 Other specified disorders of Eustachian tube, right ear: Secondary | ICD-10-CM | POA: Diagnosis not present

## 2023-04-05 DIAGNOSIS — R7989 Other specified abnormal findings of blood chemistry: Secondary | ICD-10-CM | POA: Diagnosis not present

## 2023-04-05 DIAGNOSIS — J45909 Unspecified asthma, uncomplicated: Secondary | ICD-10-CM | POA: Diagnosis not present

## 2023-04-12 DIAGNOSIS — G629 Polyneuropathy, unspecified: Secondary | ICD-10-CM | POA: Diagnosis not present

## 2023-04-12 DIAGNOSIS — F419 Anxiety disorder, unspecified: Secondary | ICD-10-CM | POA: Diagnosis not present

## 2023-04-12 DIAGNOSIS — Z1211 Encounter for screening for malignant neoplasm of colon: Secondary | ICD-10-CM | POA: Diagnosis not present

## 2023-04-12 DIAGNOSIS — G8929 Other chronic pain: Secondary | ICD-10-CM | POA: Diagnosis not present

## 2023-04-12 DIAGNOSIS — F17210 Nicotine dependence, cigarettes, uncomplicated: Secondary | ICD-10-CM | POA: Diagnosis not present

## 2023-04-12 DIAGNOSIS — Z0001 Encounter for general adult medical examination with abnormal findings: Secondary | ICD-10-CM | POA: Diagnosis not present

## 2023-04-12 DIAGNOSIS — J45909 Unspecified asthma, uncomplicated: Secondary | ICD-10-CM | POA: Diagnosis not present

## 2023-04-21 DIAGNOSIS — Z1211 Encounter for screening for malignant neoplasm of colon: Secondary | ICD-10-CM | POA: Diagnosis not present

## 2023-07-12 DIAGNOSIS — F419 Anxiety disorder, unspecified: Secondary | ICD-10-CM | POA: Diagnosis not present

## 2023-07-12 DIAGNOSIS — G629 Polyneuropathy, unspecified: Secondary | ICD-10-CM | POA: Diagnosis not present

## 2023-07-12 DIAGNOSIS — F17210 Nicotine dependence, cigarettes, uncomplicated: Secondary | ICD-10-CM | POA: Diagnosis not present

## 2023-07-12 DIAGNOSIS — J45909 Unspecified asthma, uncomplicated: Secondary | ICD-10-CM | POA: Diagnosis not present

## 2023-07-12 DIAGNOSIS — G8929 Other chronic pain: Secondary | ICD-10-CM | POA: Diagnosis not present

## 2023-07-12 DIAGNOSIS — I2510 Atherosclerotic heart disease of native coronary artery without angina pectoris: Secondary | ICD-10-CM | POA: Diagnosis not present

## 2023-10-13 DIAGNOSIS — J45909 Unspecified asthma, uncomplicated: Secondary | ICD-10-CM | POA: Diagnosis not present

## 2023-10-13 DIAGNOSIS — G629 Polyneuropathy, unspecified: Secondary | ICD-10-CM | POA: Diagnosis not present

## 2023-10-13 DIAGNOSIS — G8929 Other chronic pain: Secondary | ICD-10-CM | POA: Diagnosis not present

## 2023-10-13 DIAGNOSIS — F17210 Nicotine dependence, cigarettes, uncomplicated: Secondary | ICD-10-CM | POA: Diagnosis not present

## 2023-10-13 DIAGNOSIS — Z0000 Encounter for general adult medical examination without abnormal findings: Secondary | ICD-10-CM | POA: Diagnosis not present
